# Patient Record
Sex: Male | Born: 1935 | Race: White | Hispanic: No | Smoking: Current every day smoker
Health system: Southern US, Community
[De-identification: ages and names within clinical notes are randomized; demographics above are authoritative.]

## PROBLEM LIST (undated history)

## (undated) DIAGNOSIS — E039 Hypothyroidism, unspecified: Secondary | ICD-10-CM

## (undated) DIAGNOSIS — F419 Anxiety disorder, unspecified: Secondary | ICD-10-CM

## (undated) DIAGNOSIS — Z8489 Family history of other specified conditions: Secondary | ICD-10-CM

## (undated) DIAGNOSIS — N189 Chronic kidney disease, unspecified: Secondary | ICD-10-CM

## (undated) DIAGNOSIS — J40 Bronchitis, not specified as acute or chronic: Secondary | ICD-10-CM

## (undated) DIAGNOSIS — K2100 Gastro-esophageal reflux disease with esophagitis, without bleeding: Secondary | ICD-10-CM

## (undated) DIAGNOSIS — R0602 Shortness of breath: Secondary | ICD-10-CM

## (undated) DIAGNOSIS — Z8719 Personal history of other diseases of the digestive system: Secondary | ICD-10-CM

## (undated) DIAGNOSIS — T8859XA Other complications of anesthesia, initial encounter: Secondary | ICD-10-CM

## (undated) DIAGNOSIS — C801 Malignant (primary) neoplasm, unspecified: Secondary | ICD-10-CM

## (undated) DIAGNOSIS — B029 Zoster without complications: Secondary | ICD-10-CM

## (undated) DIAGNOSIS — I639 Cerebral infarction, unspecified: Secondary | ICD-10-CM

## (undated) DIAGNOSIS — E785 Hyperlipidemia, unspecified: Secondary | ICD-10-CM

## (undated) DIAGNOSIS — I219 Acute myocardial infarction, unspecified: Secondary | ICD-10-CM

## (undated) DIAGNOSIS — I10 Essential (primary) hypertension: Secondary | ICD-10-CM

## (undated) DIAGNOSIS — G4730 Sleep apnea, unspecified: Secondary | ICD-10-CM

## (undated) DIAGNOSIS — J449 Chronic obstructive pulmonary disease, unspecified: Secondary | ICD-10-CM

## (undated) DIAGNOSIS — K219 Gastro-esophageal reflux disease without esophagitis: Secondary | ICD-10-CM

## (undated) DIAGNOSIS — G459 Transient cerebral ischemic attack, unspecified: Secondary | ICD-10-CM

## (undated) DIAGNOSIS — I499 Cardiac arrhythmia, unspecified: Secondary | ICD-10-CM

## (undated) DIAGNOSIS — T4145XA Adverse effect of unspecified anesthetic, initial encounter: Secondary | ICD-10-CM

## (undated) HISTORY — PX: EYE SURGERY: SHX253

## (undated) HISTORY — PX: CARDIAC CATHETERIZATION: SHX172

---

## 1995-02-18 HISTORY — PX: BACK SURGERY: SHX140

## 1998-02-17 DIAGNOSIS — I219 Acute myocardial infarction, unspecified: Secondary | ICD-10-CM

## 1998-02-17 HISTORY — DX: Acute myocardial infarction, unspecified: I21.9

## 1998-02-17 HISTORY — PX: CORONARY ARTERY BYPASS GRAFT: SHX141

## 1998-12-19 ENCOUNTER — Encounter: Payer: Self-pay | Admitting: Surgery

## 1998-12-19 ENCOUNTER — Inpatient Hospital Stay (HOSPITAL_COMMUNITY): Admission: AD | Admit: 1998-12-19 | Discharge: 1998-12-26 | Payer: Self-pay | Admitting: Surgery

## 1998-12-20 ENCOUNTER — Encounter: Payer: Self-pay | Admitting: Surgery

## 1998-12-21 ENCOUNTER — Encounter: Payer: Self-pay | Admitting: Surgery

## 1998-12-22 ENCOUNTER — Encounter: Payer: Self-pay | Admitting: Surgery

## 2009-02-17 DIAGNOSIS — C801 Malignant (primary) neoplasm, unspecified: Secondary | ICD-10-CM

## 2009-02-17 HISTORY — DX: Malignant (primary) neoplasm, unspecified: C80.1

## 2009-02-17 HISTORY — PX: OTHER SURGICAL HISTORY: SHX169

## 2010-02-17 HISTORY — PX: HERNIA REPAIR: SHX51

## 2010-11-22 DIAGNOSIS — I10 Essential (primary) hypertension: Secondary | ICD-10-CM

## 2010-12-18 DIAGNOSIS — I4891 Unspecified atrial fibrillation: Secondary | ICD-10-CM

## 2012-08-26 ENCOUNTER — Encounter (HOSPITAL_COMMUNITY): Payer: Self-pay | Admitting: *Deleted

## 2012-08-27 ENCOUNTER — Encounter (HOSPITAL_COMMUNITY): Payer: Self-pay | Admitting: Pharmacy Technician

## 2012-08-31 ENCOUNTER — Other Ambulatory Visit: Payer: Self-pay | Admitting: Gastroenterology

## 2012-08-31 NOTE — Addendum Note (Signed)
Addended by: Palestine Mosco on: 08/31/2012 04:37 PM   Modules accepted: Orders  

## 2012-09-01 ENCOUNTER — Ambulatory Visit (HOSPITAL_COMMUNITY): Payer: Medicare Other | Admitting: Anesthesiology

## 2012-09-01 ENCOUNTER — Ambulatory Visit (HOSPITAL_COMMUNITY)
Admission: RE | Admit: 2012-09-01 | Discharge: 2012-09-01 | Disposition: A | Discharge status: Home / Self Care | Payer: Medicare Other | Source: Ambulatory Visit | Attending: Gastroenterology | Admitting: Gastroenterology

## 2012-09-01 ENCOUNTER — Encounter (HOSPITAL_COMMUNITY): Payer: Self-pay | Admitting: Anesthesiology

## 2012-09-01 ENCOUNTER — Encounter (HOSPITAL_COMMUNITY): Payer: Self-pay | Admitting: *Deleted

## 2012-09-01 ENCOUNTER — Encounter (HOSPITAL_COMMUNITY)
Admission: RE | Disposition: A | Discharge status: Home / Self Care | Payer: Self-pay | Source: Ambulatory Visit | Attending: Gastroenterology

## 2012-09-01 DIAGNOSIS — I252 Old myocardial infarction: Secondary | ICD-10-CM | POA: Insufficient documentation

## 2012-09-01 DIAGNOSIS — F17200 Nicotine dependence, unspecified, uncomplicated: Secondary | ICD-10-CM | POA: Insufficient documentation

## 2012-09-01 DIAGNOSIS — R6889 Other general symptoms and signs: Secondary | ICD-10-CM | POA: Insufficient documentation

## 2012-09-01 DIAGNOSIS — E039 Hypothyroidism, unspecified: Secondary | ICD-10-CM | POA: Insufficient documentation

## 2012-09-01 DIAGNOSIS — K8689 Other specified diseases of pancreas: Secondary | ICD-10-CM | POA: Insufficient documentation

## 2012-09-01 DIAGNOSIS — K219 Gastro-esophageal reflux disease without esophagitis: Secondary | ICD-10-CM | POA: Insufficient documentation

## 2012-09-01 DIAGNOSIS — K869 Disease of pancreas, unspecified: Secondary | ICD-10-CM | POA: Insufficient documentation

## 2012-09-01 DIAGNOSIS — Z951 Presence of aortocoronary bypass graft: Secondary | ICD-10-CM | POA: Insufficient documentation

## 2012-09-01 DIAGNOSIS — I2510 Atherosclerotic heart disease of native coronary artery without angina pectoris: Secondary | ICD-10-CM | POA: Insufficient documentation

## 2012-09-01 DIAGNOSIS — I4891 Unspecified atrial fibrillation: Secondary | ICD-10-CM | POA: Insufficient documentation

## 2012-09-01 HISTORY — DX: Essential (primary) hypertension: I10

## 2012-09-01 HISTORY — DX: Gastro-esophageal reflux disease without esophagitis: K21.9

## 2012-09-01 HISTORY — DX: Hypothyroidism, unspecified: E03.9

## 2012-09-01 HISTORY — DX: Anxiety disorder, unspecified: F41.9

## 2012-09-01 HISTORY — DX: Hyperlipidemia, unspecified: E78.5

## 2012-09-01 HISTORY — DX: Cardiac arrhythmia, unspecified: I49.9

## 2012-09-01 HISTORY — PX: EUS: SHX5427

## 2012-09-01 HISTORY — DX: Gastro-esophageal reflux disease with esophagitis: K21.0

## 2012-09-01 HISTORY — DX: Sleep apnea, unspecified: G47.30

## 2012-09-01 HISTORY — DX: Gastro-esophageal reflux disease with esophagitis, without bleeding: K21.00

## 2012-09-01 HISTORY — DX: Malignant (primary) neoplasm, unspecified: C80.1

## 2012-09-01 HISTORY — DX: Transient cerebral ischemic attack, unspecified: G45.9

## 2012-09-01 HISTORY — DX: Chronic obstructive pulmonary disease, unspecified: J44.9

## 2012-09-01 HISTORY — DX: Bronchitis, not specified as acute or chronic: J40

## 2012-09-01 HISTORY — PX: FINE NEEDLE ASPIRATION: SHX5430

## 2012-09-01 HISTORY — DX: Acute myocardial infarction, unspecified: I21.9

## 2012-09-01 SURGERY — ESOPHAGEAL ENDOSCOPIC ULTRASOUND (EUS) RADIAL
Anesthesia: Monitor Anesthesia Care

## 2012-09-01 MED ORDER — PROPOFOL INFUSION 10 MG/ML OPTIME
INTRAVENOUS | Status: DC | PRN
  Administered 2012-09-01: 100 ug/kg/min via INTRAVENOUS

## 2012-09-01 MED ORDER — CIPROFLOXACIN IN D5W 400 MG/200ML IV SOLN
INTRAVENOUS | Status: AC
  Filled 2012-09-01: qty 200

## 2012-09-01 MED ORDER — SODIUM CHLORIDE 0.9 % IV SOLN: INTRAVENOUS | Status: DC

## 2012-09-01 MED ORDER — BUTAMBEN-TETRACAINE-BENZOCAINE 2-2-14 % EX AERO
INHALATION_SPRAY | CUTANEOUS | Status: DC | PRN
  Administered 2012-09-01: 1 via TOPICAL

## 2012-09-01 MED ORDER — LACTATED RINGERS IV SOLN
INTRAVENOUS | Status: DC
  Administered 2012-09-01: 1000 mL via INTRAVENOUS

## 2012-09-01 MED ORDER — KETAMINE HCL 50 MG/ML IJ SOLN
INTRAMUSCULAR | Status: DC | PRN
  Administered 2012-09-01: 25 mg via INTRAMUSCULAR

## 2012-09-01 MED ORDER — EPHEDRINE SULFATE 50 MG/ML IJ SOLN
INTRAMUSCULAR | Status: DC | PRN
  Administered 2012-09-01 (×2): 5 mg via INTRAVENOUS

## 2012-09-01 MED ORDER — PROMETHAZINE HCL 25 MG/ML IJ SOLN: 6.2500 mg | INTRAMUSCULAR | Status: DC | PRN

## 2012-09-01 MED ORDER — CIPROFLOXACIN HCL 500 MG PO TABS: 500.0000 mg | ORAL_TABLET | Freq: Two times a day (BID) | ORAL | Status: DC

## 2012-09-01 MED ORDER — CIPROFLOXACIN IN D5W 400 MG/200ML IV SOLN
INTRAVENOUS | Status: DC | PRN
  Administered 2012-09-01: 400 mg via INTRAVENOUS

## 2012-09-01 MED ORDER — MIDAZOLAM HCL 5 MG/5ML IJ SOLN
INTRAMUSCULAR | Status: DC | PRN
  Administered 2012-09-01: 2 mg via INTRAVENOUS

## 2012-09-01 NOTE — Op Note (Signed)
[facility] [address] [place], [address]   ENDOSCOPIC ULTRASOUND PROCEDURE REPORT  PATIENT: Brandon Cisneros, Brandon Cisneros  MR#: [id number] BIRTHDATE: [birth year]  GENDER: Male ENDOSCOPIST: Willis Modena, MD REFERRED BY:  Duwaine Maxin, MD; Corky Downs, M.D. PROCEDURE DATE:  09/01/2012 PROCEDURE:   Upper EUS w/FNA ASA CLASS:      Class III INDICATIONS:   1.  pancreatic mass. MEDICATIONS: Cipro 400 mg IV, Cetacaine spray x 2, and MAC sedation, administered by CRNA  DESCRIPTION OF PROCEDURE:   After the risks benefits and alternatives of the procedure were  explained, informed consent was obtained. The patient was then placed in the left, lateral, decubitus postion and IV sedation was administered. Throughout the procedure, the patients blood pressure, pulse and oxygen saturations were monitored continuously.  Under direct visualization, the EUS scope  endoscope was introduced through the mouth  and advanced to the second portion of the duodenum .  Water was used as necessary to provide an acoustic interface.  Upon completion of the imaging, water was removed and the patient was sent to the recovery room in satisfactory condition.     FINDINGS:      Diffusely atrophic pancreas, with multiple parenchymal and intraductal pancreatic stones.  In general region of head and neck of pancreas, there was ill-defined amorphous, heterogenous (solid with admixed cystic components) lesion, either on superior portion of the pancreas or closely bordering it. Lesion approximately 20 x 30 mm in size.  Few small benign-appearing peripancreatic  lymph nodes seen.  Mass was biopsied (5 with 25-g needle, two in their entirety for cell block analysis).  IMPRESSION:     Pancreatic/peripancreatic lesion, biopsied as above. Extensive changes of chronic pancreatitis.  RECOMMENDATIONS:     1.  Watch for potential complications of procedure. 2.  Await cytology results. 3.  Cipro 500 mg  twice-a-day for 3 days. 4.  Will discuss with Dr. Anda Kraft, once biopsy results are back.    _______________________________ eSigned:  Willis Modena, MD 09/01/2012 12:50 PM   CC:

## 2012-09-01 NOTE — Transfer of Care (Signed)
Immediate Anesthesia Transfer of Care Note  Patient: Brandon Cisneros  Procedure(s) Performed: Procedure(s) with comments: ESOPHAGEAL ENDOSCOPIC ULTRASOUND (EUS) RADIAL (N/A) FINE NEEDLE ASPIRATION (FNA) LINEAR (N/A) - +or- fna  Patient Location: PACU and Endoscopy Unit  Anesthesia Type:MAC  Level of Consciousness: awake, alert , oriented and patient cooperative  Airway & Oxygen Therapy: Patient Spontanous Breathing and Patient connected to nasal cannula oxygen  Post-op Assessment: Report given to PACU RN, Post -op Vital signs reviewed and stable and Patient moving all extremities  Post vital signs: Reviewed and stable  Complications: No apparent anesthesia complications

## 2012-09-01 NOTE — Anesthesia Postprocedure Evaluation (Signed)
  Anesthesia Post-op Note  Patient: Brandon Cisneros  Procedure(s) Performed: Procedure(s) (LRB): ESOPHAGEAL ENDOSCOPIC ULTRASOUND (EUS) RADIAL (N/A) FINE NEEDLE ASPIRATION (FNA) LINEAR (N/A)  Patient Location: PACU  Anesthesia Type: MAC  Level of Consciousness: awake and alert   Airway and Oxygen Therapy: Patient Spontanous Breathing  Post-op Pain: mild  Post-op Assessment: Post-op Vital signs reviewed, Patient's Cardiovascular Status Stable, Respiratory Function Stable, Patent Airway and No signs of Nausea or vomiting  Last Vitals:  Filed Vitals:   09/01/12 1236  BP: 96/59  Temp: 36.6 C  Resp: 18    Post-op Vital Signs: stable   Complications: No apparent anesthesia complications

## 2012-09-01 NOTE — H&P (Signed)
Patient interval history reviewed.  Patient examined again.  There has been no change from documented H/P dated 08/26/12  (scanned into chart from our office) except as documented above.  Assessment:  1.  Pancreatic mass.  Plan:  1.  Endoscopic ultrasound with possible biopsies. 2.  Risks (bleeding, infection, bowel perforation that could require surgery, sedation-related changes in cardiopulmonary systems), benefits (identification and possible treatment of source of symptoms, exclusion of certain causes of symptoms), and alternatives (watchful waiting, radiographic imaging studies, empiric medical treatment) of upper endoscopy with ultrasound and possible biopsies (EUS +/- FNA) were explained to patient/family in detail and patient wishes to proceed.

## 2012-09-01 NOTE — Anesthesia Preprocedure Evaluation (Addendum)
Anesthesia Evaluation  Patient identified by MRN, date of birth, ID band Patient awake    Reviewed: Allergy & Precautions, H&P , NPO status , Patient's Chart, lab work & pertinent test results  Airway Mallampati: II TM Distance: >3 FB Neck ROM: Full    Dental no notable dental hx.    Pulmonary Current Smoker,  breath sounds clear to auscultation  Pulmonary exam normal       Cardiovascular hypertension, + CAD, + Past MI and + CABG + dysrhythmias Atrial Fibrillation Rhythm:Irregular Rate:Normal     Neuro/Psych TIAnegative psych ROS   GI/Hepatic Neg liver ROS, GERD-  Medicated,  Endo/Other  Hypothyroidism   Renal/GU negative Renal ROS  negative genitourinary   Musculoskeletal negative musculoskeletal ROS (+)   Abdominal   Peds negative pediatric ROS (+)  Hematology negative hematology ROS (+)   Anesthesia Other Findings   Reproductive/Obstetrics negative OB ROS                         Anesthesia Physical Anesthesia Plan  ASA: III  Anesthesia Plan: MAC   Post-op Pain Management:    Induction: Intravenous  Airway Management Planned: Nasal Cannula  Additional Equipment:   Intra-op Plan:   Post-operative Plan:   Informed Consent: I have reviewed the patients History and Physical, chart, labs and discussed the procedure including the risks, benefits and alternatives for the proposed anesthesia with the patient or authorized representative who has indicated his/her understanding and acceptance.     Plan Discussed with: CRNA and Surgeon  Anesthesia Plan Comments:         Anesthesia Quick Evaluation

## 2012-09-01 NOTE — Preoperative (Signed)
Beta Blockers   Reason not to administer Beta Blockers:Not Applicable 

## 2012-09-02 ENCOUNTER — Encounter (HOSPITAL_COMMUNITY): Payer: Self-pay | Admitting: Gastroenterology

## 2012-09-13 ENCOUNTER — Telehealth: Payer: Self-pay | Admitting: Hematology and Oncology

## 2012-09-13 NOTE — Telephone Encounter (Signed)
S/W PT DTR IN RE NP APPT 07/30 @ 10:30 W/DR. VICTOR REFERRING DR. MARTERRE DX-[place]  WELCOME PACKET MAILED.

## 2012-09-14 ENCOUNTER — Telehealth: Payer: Self-pay

## 2012-09-14 NOTE — Telephone Encounter (Signed)
C/D 09/14/12 for appt. 09/15/12

## 2012-09-15 ENCOUNTER — Ambulatory Visit: Payer: Medicare Other

## 2012-09-15 ENCOUNTER — Other Ambulatory Visit: Payer: Medicare Other | Admitting: Lab

## 2012-09-15 ENCOUNTER — Ambulatory Visit (HOSPITAL_BASED_OUTPATIENT_CLINIC_OR_DEPARTMENT_OTHER): Payer: Medicare Other | Admitting: Hematology and Oncology

## 2012-09-15 ENCOUNTER — Encounter: Payer: Self-pay | Admitting: Hematology and Oncology

## 2012-09-15 VITALS — BP 144/77 | HR 55 | Temp 97.1°F | Resp 18 | Ht 71.0 in | Wt 143.3 lb

## 2012-09-15 DIAGNOSIS — C8589 Other specified types of non-Hodgkin lymphoma, extranodal and solid organ sites: Secondary | ICD-10-CM

## 2012-09-15 DIAGNOSIS — C8590 Non-Hodgkin lymphoma, unspecified, unspecified site: Secondary | ICD-10-CM

## 2012-09-15 NOTE — Progress Notes (Signed)
Checked in new patient. No financial issues. He has a living will/POA and will bring in next time. All communication via phone and mail only.

## 2012-09-17 ENCOUNTER — Other Ambulatory Visit: Payer: Self-pay | Admitting: *Deleted

## 2012-09-17 ENCOUNTER — Telehealth: Payer: Self-pay | Admitting: Hematology and Oncology

## 2012-09-17 NOTE — Telephone Encounter (Signed)
Added lb per for 8/4 per 8/1 pof/ s/w both pt and dtr penny re lb appt for 8/4 @ 1:15pm prior to scans @ [facility] @ 2pm.

## 2012-09-19 NOTE — Progress Notes (Signed)
ID: Pilar Plate OB: 1927-03-31  MR#: [id number]  CSN#:[id number]  PCP: Corky Downs, MD  HISTORY OF PRESENT ILLNESS: The patient is a 79 y.o.man who initially developed diffuse pain for 4 days 6 weeks ago.He was seen by PCP who arranged abdominal U/S and chest x-ray. Abdominal ultrasound showed a density in pancreatic head. The patient had MRI scan which was positive for greater then 3 cm mass in pancreatic head. He had upper EUS with FNA of pancreatic mass. 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 FNA material consists of lymphoid appearing tissue characterized by relative abundance of large lymphoid cells associated with necrosis/apoptosis. Material for flow cytometric analysis is not available for analysis. Immunohistochemical stains were performed on a somewhat limited cell block material. The stains include LCA, CD20, CD79a, CD3, CD43, BCL-2, CD10 and BCL-6 with appropriate controls. The large lymphoid appearing cells are positive for LCA, CD79a, BCL-2, BCL-6 and CD20 (partial). This is admixed with a minor population of T cells as seen with CD3 and CD43. 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 Large cell type is favored. The patient and family presented today for discussion of those finding. He reported diminished appetite and weight loss about 10 lb in 3 months. He is using hearing aid. He has nasal congestion and clear nasal discharge. His dyspnea secondary to COPD is worse. Patient has cough with occasional clear sputum. Patient complains on abdominal pain which under control with medication. He handle constipation with over the counter medication. No other complaints.   Review of Systems  Constitutional: Positive for weight loss. Negative for fever, chills, malaise/fatigue and diaphoresis.  HENT: Positive for hearing loss and congestion. Negative for ear pain, nosebleeds, sore throat, neck pain, tinnitus and ear  discharge.   Eyes: Negative for blurred vision, double vision and photophobia.  Respiratory: Positive for cough, sputum production and shortness of breath. Negative for hemoptysis and wheezing.   Cardiovascular: Negative for chest pain, palpitations, orthopnea, claudication, leg swelling and PND.  Gastrointestinal: Positive for abdominal pain and constipation. Negative for heartburn, nausea, vomiting, diarrhea and blood in stool.  Genitourinary: Negative for dysuria, urgency, frequency and hematuria.  Musculoskeletal: Negative for myalgias, back pain and joint pain.  Skin: Negative for itching and rash.  Neurological: Negative for weakness and headaches.    PAST MEDICAL HISTORY: Past Medical History  Diagnosis Date  . Hypertension   . GERD (gastroesophageal reflux disease)   . Cancer 2011    prostate  . Dysrhythmia     afib  . Myocardial infarction 2000  . Anxiety     occasional, no meds for  . COPD (chronic obstructive pulmonary disease)   . Bronchitis 2 months ago  . TIA (transient ischemic attack) 5 years ago    questionable  . Sleep apnea     no cpap used  . Reflux esophagitis   . Hyperlipidemia   . Hypothyroidism     no current thryoid meds    PAST SURGICAL HISTORY: Past Surgical History  Procedure Laterality Date  . Radioactive seed implants  2011  . Hernia repair  2012  . Coronary artery bypass graft  2000    x 1  . Back surgery  1997    upper back  . Eus N/A 09/01/2012    Procedure: ESOPHAGEAL ENDOSCOPIC ULTRASOUND (EUS) RADIAL;  Surgeon: Willis Modena, MD;  Location: [facility] ENDOSCOPY;  Service: Endoscopy;  Laterality: N/A;  . Fine needle aspiration N/A 09/01/2012  Procedure: FINE NEEDLE ASPIRATION (FNA) LINEAR;  Surgeon: Willis Modena, MD;  Location: [facility] ENDOSCOPY;  Service: Endoscopy;  Laterality: N/A;  +or- fna    FAMILY HISTORY No family history on file.  HEALTH MAINTENANCE: History  Substance Use Topics  . Smoking status: Current Every Day Smoker --  0.25 packs/day for 54 years    Types: Cigarettes  . Smokeless tobacco: Never Used  . Alcohol Use: No   No Known Allergies  Current Outpatient Prescriptions  Medication Sig Dispense Refill  . amiodarone (PACERONE) 200 MG tablet Take 100 mg by mouth every morning.      Marland Kitchen aspirin 81 MG tablet Take 81 mg by mouth daily.      . clopidogrel (PLAVIX) 75 MG tablet Take 75 mg by mouth daily.      . enalapril (VASOTEC) 5 MG tablet Take 2.5 mg by mouth every morning.      . Ibuprofen-Diphenhydramine Cit (IBUPROFEN PM PO) Take 1 tablet by mouth at bedtime as needed (for sleep).      . pantoprazole (PROTONIX) 40 MG tablet Take 40 mg by mouth at bedtime.      . simvastatin (ZOCOR) 20 MG tablet Take 20 mg by mouth every evening.      . tamsulosin (FLOMAX) 0.4 MG CAPS Take 0.4 mg by mouth daily.       No current facility-administered medications for this visit.    OBJECTIVE: Filed Vitals:   09/15/12 1109  BP: 144/77  Pulse: 55  Temp: 97.1 F (36.2 C)  Resp: 18     Body mass index is 20 kg/(m^2).    ECOG FS: 1  HEENT: Sclerae anicteric.  Conjunctivae were pink. Pupils round and reactive bilaterally. Oral mucosa is moist without ulceration or thrush. No occipital, submandibular, cervical, supraclavicular or axillar adenopathy. Lungs: clear to auscultation without wheezes. No rales or rhonchi. Heart: regular rate and rhythm. No murmur, gallop or rubs. Abdomen: soft, non tender. No guarding or rebound tenderness. Bowel sounds are present. No palpable hepatosplenomegaly. MSK: no focal spinal tenderness. Extremities: No clubbing or cyanosis.No calf tenderness to palpitation, no peripheral edema. The patient had grossly intact strength in upper and lower extremities Neuro: non-focal, alert and oriented to time, person and place, appropriate affect  LAB RESULTS:  CMP  No results found for this basename: na, k, cl, co2, glucose, bun, creatinine, calcium, prot, albumin, ast, alt, alkphos, bilitot,  gfrnonaa, gfraa    I No results found for this basename: SPEP, UPEP,  kappa and lambda light chains    No results found for this basename: WBC, NEUTROABS, HGB, HCT, MCV, PLT      Chemistry   No results found for this basename: NA, K, CL, CO2, BUN, CREATININE, GLU   No results found for this basename: CALCIUM, ALKPHOS, AST, ALT, BILITOT      STUDIES: No results found.  ASSESSMENT:  Pancreatic mass, FNA of which reported FINDINGS CONSISTENT WITH HIGH GRADE B CELL LYMPHOPROLIFERATIVE PROCESS, PLAN: We discussed NCCN guidelines recommendations for initial work up for presumable DLBCL. We will proceed with PET/CT then excisional LN biopsy will be arrange.Bone marrow biopsy will be done. CBC, uric acid, LDH, Comprehensive metabolic panel, hepatitis B testing will be done. I will see patient next week when all results will be available.   Myra Rude, MD   09/19/2012 11:47 PM

## 2012-09-20 ENCOUNTER — Encounter (HOSPITAL_COMMUNITY)
Admission: RE | Admit: 2012-09-20 | Discharge: 2012-09-20 | Disposition: A | Discharge status: Home / Self Care | Payer: Medicare Other | Source: Ambulatory Visit | Attending: Hematology and Oncology | Admitting: Hematology and Oncology

## 2012-09-20 ENCOUNTER — Other Ambulatory Visit (HOSPITAL_BASED_OUTPATIENT_CLINIC_OR_DEPARTMENT_OTHER): Payer: Medicare Other

## 2012-09-20 ENCOUNTER — Ambulatory Visit (HOSPITAL_COMMUNITY)
Admission: RE | Admit: 2012-09-20 | Discharge: 2012-09-20 | Disposition: A | Discharge status: Home / Self Care | Payer: Medicare Other | Source: Ambulatory Visit | Attending: Hematology and Oncology | Admitting: Hematology and Oncology

## 2012-09-20 DIAGNOSIS — K7689 Other specified diseases of liver: Secondary | ICD-10-CM | POA: Insufficient documentation

## 2012-09-20 DIAGNOSIS — I714 Abdominal aortic aneurysm, without rupture, unspecified: Secondary | ICD-10-CM | POA: Insufficient documentation

## 2012-09-20 DIAGNOSIS — C8590 Non-Hodgkin lymphoma, unspecified, unspecified site: Secondary | ICD-10-CM

## 2012-09-20 DIAGNOSIS — R599 Enlarged lymph nodes, unspecified: Secondary | ICD-10-CM | POA: Insufficient documentation

## 2012-09-20 DIAGNOSIS — J986 Disorders of diaphragm: Secondary | ICD-10-CM | POA: Insufficient documentation

## 2012-09-20 DIAGNOSIS — C8589 Other specified types of non-Hodgkin lymphoma, extranodal and solid organ sites: Secondary | ICD-10-CM

## 2012-09-20 DIAGNOSIS — C61 Malignant neoplasm of prostate: Secondary | ICD-10-CM | POA: Insufficient documentation

## 2012-09-20 DIAGNOSIS — R911 Solitary pulmonary nodule: Secondary | ICD-10-CM | POA: Insufficient documentation

## 2012-09-20 DIAGNOSIS — K861 Other chronic pancreatitis: Secondary | ICD-10-CM | POA: Insufficient documentation

## 2012-09-20 DIAGNOSIS — I7090 Unspecified atherosclerosis: Secondary | ICD-10-CM | POA: Insufficient documentation

## 2012-09-20 LAB — URIC ACID (CC13): Uric Acid, Serum: 4.1 mg/dl (ref 2.6–7.4)

## 2012-09-20 LAB — COMPREHENSIVE METABOLIC PANEL (CC13)
AST: 15 U/L (ref 5–34)
Albumin: 3.3 g/dL — ABNORMAL LOW (ref 3.5–5.0)
Alkaline Phosphatase: 57 U/L (ref 40–150)
BUN: 12.9 mg/dL (ref 7.0–26.0)
Calcium: 8.9 mg/dL (ref 8.4–10.4)
Chloride: 105 mEq/L (ref 98–109)
Glucose: 81 mg/dl (ref 70–140)
Potassium: 4.6 mEq/L (ref 3.5–5.1)
Sodium: 134 mEq/L — ABNORMAL LOW (ref 136–145)
Total Protein: 6.7 g/dL (ref 6.4–8.3)

## 2012-09-20 LAB — GLUCOSE, CAPILLARY: Glucose-Capillary: 89 mg/dL (ref 70–99)

## 2012-09-20 LAB — CBC WITH DIFFERENTIAL/PLATELET
Basophils Absolute: 0.1 10*3/uL (ref 0.0–0.1)
Eosinophils Absolute: 0.4 10*3/uL (ref 0.0–0.5)
HGB: 12.4 g/dL — ABNORMAL LOW (ref 13.0–17.1)
NEUT#: 5 10*3/uL (ref 1.5–6.5)
RBC: 4.17 10*6/uL — ABNORMAL LOW (ref 4.20–5.82)
RDW: 14 % (ref 11.0–14.6)
WBC: 8 10*3/uL (ref 4.0–10.3)
lymph#: 1.8 10*3/uL (ref 0.9–3.3)

## 2012-09-20 MED ORDER — IOHEXOL 300 MG/ML  SOLN
80.0000 mL | Freq: Once | INTRAMUSCULAR | Status: AC | PRN
  Administered 2012-09-20: 80 mL via INTRAVENOUS

## 2012-09-20 MED ORDER — FLUDEOXYGLUCOSE F - 18 (FDG) INJECTION
17.2000 | Freq: Once | INTRAVENOUS | Status: AC | PRN
  Administered 2012-09-20: 17.2 via INTRAVENOUS

## 2012-09-22 ENCOUNTER — Other Ambulatory Visit: Payer: Self-pay

## 2012-09-27 ENCOUNTER — Encounter (HOSPITAL_COMMUNITY): Payer: Self-pay | Admitting: Pharmacy Technician

## 2012-09-28 ENCOUNTER — Encounter: Payer: Self-pay | Admitting: *Deleted

## 2012-09-28 ENCOUNTER — Other Ambulatory Visit: Payer: Self-pay | Admitting: Radiology

## 2012-09-28 NOTE — Progress Notes (Signed)
Copies of Advanced Directives, POA received,  Given to Medical Records to scan into EMR.

## 2012-09-29 ENCOUNTER — Other Ambulatory Visit: Payer: Self-pay | Admitting: *Deleted

## 2012-09-30 ENCOUNTER — Ambulatory Visit (HOSPITAL_COMMUNITY)
Admission: RE | Admit: 2012-09-30 | Discharge: 2012-09-30 | Disposition: A | Discharge status: Home / Self Care | Payer: Medicare Other | Source: Ambulatory Visit | Attending: Hematology and Oncology | Admitting: Hematology and Oncology

## 2012-09-30 DIAGNOSIS — Z951 Presence of aortocoronary bypass graft: Secondary | ICD-10-CM | POA: Diagnosis not present

## 2012-09-30 DIAGNOSIS — D649 Anemia, unspecified: Secondary | ICD-10-CM | POA: Insufficient documentation

## 2012-09-30 DIAGNOSIS — K219 Gastro-esophageal reflux disease without esophagitis: Secondary | ICD-10-CM | POA: Insufficient documentation

## 2012-09-30 DIAGNOSIS — Z79899 Other long term (current) drug therapy: Secondary | ICD-10-CM | POA: Insufficient documentation

## 2012-09-30 DIAGNOSIS — E785 Hyperlipidemia, unspecified: Secondary | ICD-10-CM | POA: Insufficient documentation

## 2012-09-30 DIAGNOSIS — I10 Essential (primary) hypertension: Secondary | ICD-10-CM | POA: Diagnosis not present

## 2012-09-30 DIAGNOSIS — R634 Abnormal weight loss: Secondary | ICD-10-CM | POA: Diagnosis not present

## 2012-09-30 DIAGNOSIS — Z8673 Personal history of transient ischemic attack (TIA), and cerebral infarction without residual deficits: Secondary | ICD-10-CM | POA: Diagnosis not present

## 2012-09-30 DIAGNOSIS — K869 Disease of pancreas, unspecified: Secondary | ICD-10-CM | POA: Diagnosis not present

## 2012-09-30 DIAGNOSIS — I252 Old myocardial infarction: Secondary | ICD-10-CM | POA: Insufficient documentation

## 2012-09-30 DIAGNOSIS — D47Z9 Other specified neoplasms of uncertain behavior of lymphoid, hematopoietic and related tissue: Secondary | ICD-10-CM | POA: Insufficient documentation

## 2012-09-30 DIAGNOSIS — C8590 Non-Hodgkin lymphoma, unspecified, unspecified site: Secondary | ICD-10-CM

## 2012-09-30 DIAGNOSIS — J4489 Other specified chronic obstructive pulmonary disease: Secondary | ICD-10-CM | POA: Insufficient documentation

## 2012-09-30 DIAGNOSIS — J449 Chronic obstructive pulmonary disease, unspecified: Secondary | ICD-10-CM | POA: Insufficient documentation

## 2012-09-30 LAB — CBC
Hemoglobin: 12 g/dL — ABNORMAL LOW (ref 13.0–17.0)
MCH: 29.3 pg (ref 26.0–34.0)
MCHC: 34.1 g/dL (ref 30.0–36.0)
MCV: 86.1 fL (ref 78.0–100.0)
RBC: 4.09 MIL/uL — ABNORMAL LOW (ref 4.22–5.81)

## 2012-09-30 LAB — PROTIME-INR: Prothrombin Time: 12.6 seconds (ref 11.6–15.2)

## 2012-09-30 MED ORDER — MIDAZOLAM HCL 2 MG/2ML IJ SOLN
INTRAMUSCULAR | Status: AC
  Filled 2012-09-30: qty 4

## 2012-09-30 MED ORDER — MIDAZOLAM HCL 2 MG/2ML IJ SOLN
INTRAMUSCULAR | Status: AC | PRN
  Administered 2012-09-30: 1 mg via INTRAVENOUS

## 2012-09-30 MED ORDER — FENTANYL CITRATE 0.05 MG/ML IJ SOLN
INTRAMUSCULAR | Status: AC
  Filled 2012-09-30: qty 4

## 2012-09-30 MED ORDER — HYDROCODONE-ACETAMINOPHEN 5-325 MG PO TABS
1.0000 | ORAL_TABLET | ORAL | Status: DC | PRN
  Filled 2012-09-30: qty 2

## 2012-09-30 MED ORDER — SODIUM CHLORIDE 0.9 % IV SOLN
INTRAVENOUS | Status: DC
  Administered 2012-09-30: 09:00:00 via INTRAVENOUS

## 2012-09-30 MED ORDER — FENTANYL CITRATE 0.05 MG/ML IJ SOLN
INTRAMUSCULAR | Status: AC | PRN
  Administered 2012-09-30 (×2): 50 ug via INTRAVENOUS

## 2012-09-30 NOTE — H&P (Signed)
Brandon Cisneros is an 79 y.o. male.   Chief Complaint: "I'm having a bone marrow biopsy" HPI: Patient with history of high grade B cell lymphoproliferative process noted in a recent pancreatic mass biopsy presents today for CT guided bone marrow biopsy.  Past Medical History  Diagnosis Date  . Hypertension   . GERD (gastroesophageal reflux disease)   . Cancer 2011    prostate  . Dysrhythmia     afib  . Myocardial infarction 2000  . Anxiety     occasional, no meds for  . COPD (chronic obstructive pulmonary disease)   . Bronchitis 2 months ago  . TIA (transient ischemic attack) 5 years ago    questionable  . Sleep apnea     no cpap used  . Reflux esophagitis   . Hyperlipidemia   . Hypothyroidism     no current thryoid meds    Past Surgical History  Procedure Laterality Date  . Radioactive seed implants  2011  . Hernia repair  2012  . Coronary artery bypass graft  2000    x 1  . Back surgery  1997    upper back  . Eus N/A 09/01/2012    Procedure: ESOPHAGEAL ENDOSCOPIC ULTRASOUND (EUS) RADIAL;  Surgeon: Willis Modena, MD;  Location: [facility] ENDOSCOPY;  Service: Endoscopy;  Laterality: N/A;  . Fine needle aspiration N/A 09/01/2012    Procedure: FINE NEEDLE ASPIRATION (FNA) LINEAR;  Surgeon: Willis Modena, MD;  Location: [facility] ENDOSCOPY;  Service: Endoscopy;  Laterality: N/A;  +or- fna    No family history on file. Social History:  reports that he has been smoking Cigarettes.  He has a 13.5 pack-year smoking history. He has never used smokeless tobacco. He reports that he does not drink alcohol or use illicit drugs.  Allergies: No Known Allergies  Current outpatient prescriptions:amiodarone (PACERONE) 200 MG tablet, Take 100 mg by mouth every morning., Disp: , Rfl: ;  aspirin 81 MG tablet, Take 81 mg by mouth daily., Disp: , Rfl: ;  enalapril (VASOTEC) 5 MG tablet, Take 2.5 mg by mouth every morning., Disp: , Rfl: ;  Ibuprofen-Diphenhydramine Cit (IBUPROFEN PM PO), Take 1 tablet by  mouth at bedtime as needed (for sleep)., Disp: , Rfl:  pantoprazole (PROTONIX) 40 MG tablet, Take 40 mg by mouth at bedtime., Disp: , Rfl: ;  simvastatin (ZOCOR) 20 MG tablet, Take 20 mg by mouth every evening., Disp: , Rfl: ;  tamsulosin (FLOMAX) 0.4 MG CAPS, Take 0.4 mg by mouth daily., Disp: , Rfl: ;  clopidogrel (PLAVIX) 75 MG tablet, Take 75 mg by mouth daily., Disp: , Rfl:  Current facility-administered medications:0.9 %  sodium chloride infusion, , Intravenous, Continuous, D Jeananne Rama, PA-C, Last Rate: 20 mL/hr at 09/30/12 0911   Results for orders placed during the hospital encounter of 09/30/12  APTT      Result Value Range   aPTT 36  24 - 37 seconds  CBC      Result Value Range   WBC 6.8  4.0 - 10.5 K/uL   RBC 4.09 (*) 4.22 - 5.81 MIL/uL   Hemoglobin 12.0 (*) 13.0 - 17.0 g/dL   HCT 40.9 (*) 81.1 - 91.4 %   MCV 86.1  78.0 - 100.0 fL   MCH 29.3  26.0 - 34.0 pg   MCHC 34.1  30.0 - 36.0 g/dL   RDW 78.2  95.6 - 21.3 %   Platelets 305  150 - 400 K/uL  PROTIME-INR  Result Value Range   Prothrombin Time 12.6  11.6 - 15.2 seconds   INR 0.96  0.00 - 1.49    Review of Systems  Constitutional: Positive for weight loss. Negative for fever and chills.  Respiratory:       Occ cough/dyspnea with exertion  Cardiovascular: Negative for chest pain.  Gastrointestinal: Negative for nausea, vomiting and blood in stool.       Occ abd pain  Musculoskeletal: Negative for back pain.  Neurological: Negative for headaches.  Endo/Heme/Allergies: Bruises/bleeds easily.    Blood pressure 132/68, pulse 58, temperature 97.4 F (36.3 C), temperature source Oral, resp. rate 18, height 5\' 11"  (1.803 m), weight 143 lb (64.864 kg), SpO2 100.00%. Physical Exam  Constitutional: He is oriented to person, place, and time.  Thin elderly WM in NAD  Cardiovascular: Normal rate and regular rhythm.   Respiratory: Effort normal and breath sounds normal.  GI: Soft. Bowel sounds are normal. There is no  tenderness.  Musculoskeletal: Normal range of motion. He exhibits no edema.  Neurological: He is alert and oriented to person, place, and time.     Assessment/Plan Pt with hx high grade B cell lymphoproliferative process noted in recent pancreatic mass biopsy. Plan is for CT guided bone marrow biopsy today for further staging. Details/risks of procedure d/w pt/daughter with their understanding and consent.  ALLRED,D KEVIN 09/30/2012, 9:16 AM

## 2012-09-30 NOTE — Procedures (Signed)
CT guided bone marrow aspirate and biopsy.  No immediate complication. 

## 2012-10-01 ENCOUNTER — Telehealth: Payer: Self-pay | Admitting: *Deleted

## 2012-10-01 ENCOUNTER — Telehealth: Payer: Self-pay | Admitting: Hematology and Oncology

## 2012-10-01 NOTE — Telephone Encounter (Signed)
s.w. pt daughter and Corlis Leak n 8.22.14 appt with Dr. Jamey Ripa @ 10:15am

## 2012-10-01 NOTE — Telephone Encounter (Signed)
Pt scheduled to see Surgeon, Dr. Jamey Ripa on 8/22, consult for possible excisional lymph node biopsy.   Daughter asks if pt is to continue to hold his Plavix until he sees Dr. Jamey Ripa?  Pt has been holding since 8/12 for BMBx he had yesterday.  Pt is seeing his cardiologist this coming Monday on 8/18.   Instructed for pt to resume Plavix today and to ask Cardiologist who prescribes med about holding for surgeon's visit.  Explained that Surgery consult is office visit only and biopsy not scheduled yet.  They will need to see pt first.  Asked her to please let us know as soon as Biopsy is scheduled so we can schedule f/u appt w/ Dr. Alecia Lemming for one week after Biopsy.  She verbalized understanding.

## 2012-10-08 ENCOUNTER — Ambulatory Visit (INDEPENDENT_AMBULATORY_CARE_PROVIDER_SITE_OTHER): Payer: Medicare Other | Admitting: Surgery

## 2012-10-08 ENCOUNTER — Encounter (HOSPITAL_COMMUNITY): Payer: Self-pay | Admitting: Pharmacy Technician

## 2012-10-08 ENCOUNTER — Other Ambulatory Visit: Payer: Self-pay

## 2012-10-08 ENCOUNTER — Encounter (INDEPENDENT_AMBULATORY_CARE_PROVIDER_SITE_OTHER): Payer: Self-pay | Admitting: Surgery

## 2012-10-08 ENCOUNTER — Telehealth: Payer: Self-pay | Admitting: *Deleted

## 2012-10-08 ENCOUNTER — Telehealth: Payer: Self-pay | Admitting: Dietician

## 2012-10-08 VITALS — BP 136/76 | HR 61 | Temp 98.0°F | Resp 18 | Ht 71.5 in | Wt 143.0 lb

## 2012-10-08 DIAGNOSIS — C8588 Other specified types of non-Hodgkin lymphoma, lymph nodes of multiple sites: Secondary | ICD-10-CM

## 2012-10-08 NOTE — Progress Notes (Signed)
NAME: Brandon Cisneros       DOB: 12/28/1927           DATE: 10/08/2012       MRN:[id number]  CC:  Chief Complaint  Patient presents with  . New Evaluation    Lymphoma    HPI: this patient has being evaluated for lymphoma. Details are noted in prior progress notes but he basically has had a diagnosis of some lymph node proliferative disorder on a biopsy of a peripancreatic area. PET scan showed abnormal activity in some lymph nodes and an axillary lymph node biopsy has been requested by the oncologist. A biopsy of his bone marrow was negative. He is not having any axillary symptoms.  Past history family history review of systems are within the electronic medical record, reviewed but not dictated to this note.  EXAM: Vital signs: BP 136/76  Pulse 61  Temp(Src) 98 F (36.7 C)  Resp 18  Ht 5' 11.5" (1.816 m)  Wt 143 lb (64.864 kg)  BMI 19.67 kg/m2  General: Patient alert, oriented, NAD  Lungs: Clear to auscultation no respiratory problems Heart: Regular rhythm no murmurs rubs or gallops Abdomen: Soft and benign. No masses, no organomegaly.  Lymphatics: I do not appreciate any cervical adenopathy. He has a firm lymph node in the right axilla that is relatively low accessible. He also has some higher lymph nodes in the left axilla that are palpable.  Data reviewed: I reviewed the prior notes and laboratory studies. I discussed his PET scan with the radiologist. He does have activity in both axillas consistent with malignant activity. IMP: lymphoma  PLAN: I reviewed the indications risks and complications of axillary lymph node biopsy. We'll try to get this set up for him at his convenience. He is already off his Plavix. He understands this as an outpatient biopsy which involves removal of an entire lymph node.  Tex Conroy J 10/08/2012   

## 2012-10-08 NOTE — Telephone Encounter (Signed)
Patient's daughter Brandon Cisneros called reporting her dad's biopsy is scheduled for next Tuesday, 10-12-2012.  Reports Dr. Vertell Limber plan is to see Brandon Cisneros a week after the biopsy.  Will notify providers.

## 2012-10-08 NOTE — Telephone Encounter (Signed)
Penny called; Dr. Avel Peace is performing the Biopsy.

## 2012-10-08 NOTE — Telephone Encounter (Signed)
Patient's daughter Brandon Cisneros called reporting her dad's biopsy is scheduled for next Tuesday, 10-12-2012. Reports Dr. Vertell Limber plan is to see Brandon Cisneros a week after the biopsy. Will notify providers.   Notified Dr. Mitchell Heir of this and he would like to call the doctor who will be perfoming the biopsy.  Message left for [facility] to cal with this information.

## 2012-10-11 ENCOUNTER — Encounter (HOSPITAL_COMMUNITY): Payer: Self-pay

## 2012-10-11 ENCOUNTER — Other Ambulatory Visit (INDEPENDENT_AMBULATORY_CARE_PROVIDER_SITE_OTHER): Payer: Self-pay | Admitting: General Surgery

## 2012-10-11 ENCOUNTER — Other Ambulatory Visit: Payer: Self-pay

## 2012-10-11 ENCOUNTER — Encounter (HOSPITAL_COMMUNITY)
Admission: RE | Admit: 2012-10-11 | Discharge: 2012-10-11 | Disposition: A | Discharge status: Home / Self Care | Payer: Medicare Other | Source: Ambulatory Visit | Attending: General Surgery | Admitting: General Surgery

## 2012-10-11 HISTORY — DX: Shortness of breath: R06.02

## 2012-10-11 LAB — CBC
HCT: 36.8 % — ABNORMAL LOW (ref 39.0–52.0)
Hemoglobin: 12.4 g/dL — ABNORMAL LOW (ref 13.0–17.0)
MCH: 29.5 pg (ref 26.0–34.0)
MCHC: 33.7 g/dL (ref 30.0–36.0)
MCV: 87.6 fL (ref 78.0–100.0)
RDW: 14.5 % (ref 11.5–15.5)

## 2012-10-11 LAB — BASIC METABOLIC PANEL
BUN: 11 mg/dL (ref 6–23)
Calcium: 9 mg/dL (ref 8.4–10.5)
Creatinine, Ser: 1.41 mg/dL — ABNORMAL HIGH (ref 0.50–1.35)
GFR calc non Af Amer: 45 mL/min — ABNORMAL LOW (ref >90)
Glucose, Bld: 82 mg/dL (ref 70–99)
Potassium: 5.1 mEq/L (ref 3.5–5.1)

## 2012-10-11 NOTE — Patient Instructions (Addendum)
20 GAVIN TELFORD  10/11/2012   Your procedure is scheduled on: 10/12/12   Report to [facility] at 8:15 AM.  Call this number if you have problems the morning of surgery [phone]: [phone]   Remember:   Do not eat food or drink liquids After Midnight.     Take these medicines the morning of surgery with A SIP OF WATER: amiodarone, ativan if needed   Do not wear jewelry, make-up or nail polish.  Do not wear lotions, powders, or perfumes. You may wear deodorant.  Do not shave 48 hours prior to surgery. Men may shave face and neck.  Do not bring valuables to the hospital.  Contacts, dentures or bridgework may not be worn into surgery.    Patients discharged the day of surgery will not be allowed to drive home.  Name and phone number of your driver: Boyd Kerbs [phone]   Birdie Sons, RN  pre op nurse call if needed [phone]    FAILURE TO FOLLOW THESE INSTRUCTIONS MAY RESULT IN CANCELLATION OF YOUR SURGERY   Patient Signature: ___________________________________________

## 2012-10-11 NOTE — Progress Notes (Signed)
Order to obtain consent states that the surgeon is Dr. Jamey Ripa. Pt states that the surgeon is Dr. Abbey Chatters. Please place a new consent order in. Pt is having surgery 10/12/12

## 2012-10-11 NOTE — Progress Notes (Signed)
CT chest 09/20/12 on EPIC, PET scan 09/20/12 on EPIC

## 2012-10-12 ENCOUNTER — Ambulatory Visit (HOSPITAL_COMMUNITY)
Admission: RE | Admit: 2012-10-12 | Discharge: 2012-10-12 | Disposition: A | Discharge status: Home / Self Care | Payer: Medicare Other | Source: Ambulatory Visit | Attending: General Surgery | Admitting: General Surgery

## 2012-10-12 ENCOUNTER — Encounter (HOSPITAL_COMMUNITY): Payer: Self-pay | Admitting: Certified Registered Nurse Anesthetist

## 2012-10-12 ENCOUNTER — Ambulatory Visit (HOSPITAL_COMMUNITY): Payer: Medicare Other | Admitting: Certified Registered Nurse Anesthetist

## 2012-10-12 ENCOUNTER — Encounter (HOSPITAL_COMMUNITY)
Admission: RE | Disposition: A | Discharge status: Home / Self Care | Payer: Self-pay | Source: Ambulatory Visit | Attending: General Surgery

## 2012-10-12 ENCOUNTER — Telehealth: Payer: Self-pay | Admitting: Hematology and Oncology

## 2012-10-12 DIAGNOSIS — G4730 Sleep apnea, unspecified: Secondary | ICD-10-CM | POA: Insufficient documentation

## 2012-10-12 DIAGNOSIS — I10 Essential (primary) hypertension: Secondary | ICD-10-CM | POA: Insufficient documentation

## 2012-10-12 DIAGNOSIS — I4891 Unspecified atrial fibrillation: Secondary | ICD-10-CM | POA: Insufficient documentation

## 2012-10-12 DIAGNOSIS — E039 Hypothyroidism, unspecified: Secondary | ICD-10-CM | POA: Insufficient documentation

## 2012-10-12 DIAGNOSIS — Z951 Presence of aortocoronary bypass graft: Secondary | ICD-10-CM | POA: Insufficient documentation

## 2012-10-12 DIAGNOSIS — I252 Old myocardial infarction: Secondary | ICD-10-CM | POA: Insufficient documentation

## 2012-10-12 DIAGNOSIS — C8299 Follicular lymphoma, unspecified, extranodal and solid organ sites: Secondary | ICD-10-CM | POA: Insufficient documentation

## 2012-10-12 DIAGNOSIS — R599 Enlarged lymph nodes, unspecified: Secondary | ICD-10-CM | POA: Insufficient documentation

## 2012-10-12 DIAGNOSIS — C8584 Other specified types of non-Hodgkin lymphoma, lymph nodes of axilla and upper limb: Secondary | ICD-10-CM | POA: Insufficient documentation

## 2012-10-12 DIAGNOSIS — J449 Chronic obstructive pulmonary disease, unspecified: Secondary | ICD-10-CM | POA: Insufficient documentation

## 2012-10-12 DIAGNOSIS — J4489 Other specified chronic obstructive pulmonary disease: Secondary | ICD-10-CM | POA: Insufficient documentation

## 2012-10-12 HISTORY — PX: AXILLARY LYMPH NODE BIOPSY: SHX5737

## 2012-10-12 SURGERY — AXILLARY LYMPH NODE BIOPSY
Anesthesia: General | Site: Axilla | Laterality: Right | Wound class: Clean

## 2012-10-12 MED ORDER — FENTANYL CITRATE 0.05 MG/ML IJ SOLN: 25.0000 ug | INTRAMUSCULAR | Status: DC | PRN

## 2012-10-12 MED ORDER — HYDROCODONE-ACETAMINOPHEN 5-325 MG PO TABS: 1.0000 | ORAL_TABLET | Freq: Once | ORAL | Status: DC

## 2012-10-12 MED ORDER — BUPIVACAINE-EPINEPHRINE (PF) 0.5% -1:200000 IJ SOLN
INTRAMUSCULAR | Status: AC
  Filled 2012-10-12: qty 10

## 2012-10-12 MED ORDER — CEFAZOLIN SODIUM-DEXTROSE 2-3 GM-% IV SOLR
2.0000 g | INTRAVENOUS | Status: AC
  Administered 2012-10-12: 2 g via INTRAVENOUS

## 2012-10-12 MED ORDER — CEFAZOLIN SODIUM-DEXTROSE 2-3 GM-% IV SOLR
INTRAVENOUS | Status: AC
  Filled 2012-10-12: qty 50

## 2012-10-12 MED ORDER — MORPHINE SULFATE 10 MG/ML IJ SOLN: 1.0000 mg | INTRAMUSCULAR | Status: DC | PRN

## 2012-10-12 MED ORDER — FENTANYL CITRATE 0.05 MG/ML IJ SOLN
INTRAMUSCULAR | Status: DC | PRN
  Administered 2012-10-12: 100 ug via INTRAVENOUS

## 2012-10-12 MED ORDER — ONDANSETRON HCL 4 MG/2ML IJ SOLN: 4.0000 mg | Freq: Four times a day (QID) | INTRAMUSCULAR | Status: DC | PRN

## 2012-10-12 MED ORDER — LACTATED RINGERS IV SOLN: INTRAVENOUS | Status: DC

## 2012-10-12 MED ORDER — ACETAMINOPHEN 650 MG RE SUPP
650.0000 mg | RECTAL | Status: DC | PRN
  Filled 2012-10-12: qty 1

## 2012-10-12 MED ORDER — 0.9 % SODIUM CHLORIDE (POUR BTL) OPTIME
TOPICAL | Status: DC | PRN
  Administered 2012-10-12: 1000 mL

## 2012-10-12 MED ORDER — HYDROCODONE-ACETAMINOPHEN 5-325 MG PO TABS: 1.0000 | ORAL_TABLET | ORAL | Status: DC | PRN

## 2012-10-12 MED ORDER — ACETAMINOPHEN 500 MG PO TABS
1000.0000 mg | ORAL_TABLET | Freq: Four times a day (QID) | ORAL | Status: DC
  Filled 2012-10-12 (×4): qty 2

## 2012-10-12 MED ORDER — EPHEDRINE SULFATE 50 MG/ML IJ SOLN
INTRAMUSCULAR | Status: DC | PRN
  Administered 2012-10-12: 10 mg via INTRAVENOUS

## 2012-10-12 MED ORDER — LIDOCAINE HCL 1 % IJ SOLN
INTRAMUSCULAR | Status: DC | PRN
  Administered 2012-10-12: 60 mg via INTRADERMAL

## 2012-10-12 MED ORDER — LACTATED RINGERS IV SOLN
INTRAVENOUS | Status: DC | PRN
  Administered 2012-10-12 (×2): via INTRAVENOUS

## 2012-10-12 MED ORDER — ATROPINE SULFATE 0.4 MG/ML IJ SOLN
INTRAMUSCULAR | Status: DC | PRN
  Administered 2012-10-12: 0.4 mg via INTRAVENOUS

## 2012-10-12 MED ORDER — SODIUM CHLORIDE 0.9 % IJ SOLN: 3.0000 mL | INTRAMUSCULAR | Status: DC | PRN

## 2012-10-12 MED ORDER — PROPOFOL 10 MG/ML IV BOLUS
INTRAVENOUS | Status: DC | PRN
  Administered 2012-10-12: 50 mg via INTRAVENOUS
  Administered 2012-10-12: 100 mg via INTRAVENOUS

## 2012-10-12 MED ORDER — ACETAMINOPHEN 325 MG PO TABS: 650.0000 mg | ORAL_TABLET | ORAL | Status: DC | PRN

## 2012-10-12 MED ORDER — BUPIVACAINE-EPINEPHRINE PF 0.5-1:200000 % IJ SOLN
INTRAMUSCULAR | Status: DC | PRN
  Administered 2012-10-12: 10 mL

## 2012-10-12 SURGICAL SUPPLY — 32 items
APL SKNCLS STERI-STRIP NONHPOA (GAUZE/BANDAGES/DRESSINGS) ×1
APPLIER CLIP 9.375 MED OPEN (MISCELLANEOUS) ×2
APR CLP MED 9.3 20 MLT OPN (MISCELLANEOUS) ×1
BANDAGE ELASTIC 6 VELCRO ST LF (GAUZE/BANDAGES/DRESSINGS) ×1 IMPLANT
BENZOIN TINCTURE PRP APPL 2/3 (GAUZE/BANDAGES/DRESSINGS) ×2 IMPLANT
BINDER BREAST LRG (GAUZE/BANDAGES/DRESSINGS) IMPLANT
BINDER BREAST XLRG (GAUZE/BANDAGES/DRESSINGS) IMPLANT
CHLORAPREP W/TINT 26ML (MISCELLANEOUS) ×2 IMPLANT
CLIP APPLIE 9.375 MED OPEN (MISCELLANEOUS) ×1 IMPLANT
CLOTH BEACON ORANGE TIMEOUT ST (SAFETY) ×2 IMPLANT
COVER SURGICAL LIGHT HANDLE (MISCELLANEOUS) ×2 IMPLANT
DRAIN CHANNEL 19F RND (DRAIN) ×1 IMPLANT
DRAPE LAPAROSCOPIC ABDOMINAL (DRAPES) ×2 IMPLANT
DRAPE UTILITY 15X26 (DRAPE) ×5 IMPLANT
ELECT REM PT RETURN 9FT ADLT (ELECTROSURGICAL) ×2
ELECTRODE REM PT RTRN 9FT ADLT (ELECTROSURGICAL) ×1 IMPLANT
EVACUATOR SILICONE 100CC (DRAIN) ×1 IMPLANT
GLOVE BIOGEL PI IND STRL 8 (GLOVE) ×1 IMPLANT
GLOVE BIOGEL PI INDICATOR 8 (GLOVE) ×1
GLOVE ECLIPSE 8.0 STRL XLNG CF (GLOVE) ×2 IMPLANT
GOWN STRL NON-REIN LRG LVL3 (GOWN DISPOSABLE) ×4 IMPLANT
KIT BASIN OR (CUSTOM PROCEDURE TRAY) ×2 IMPLANT
NS IRRIG 1000ML POUR BTL (IV SOLUTION) ×2 IMPLANT
PACK GENERAL/GYN (CUSTOM PROCEDURE TRAY) ×2 IMPLANT
SPONGE GAUZE 4X4 12PLY (GAUZE/BANDAGES/DRESSINGS) ×2 IMPLANT
STAPLER VISISTAT 35W (STAPLE) ×1 IMPLANT
STRIP CLOSURE SKIN 1/2X4 (GAUZE/BANDAGES/DRESSINGS) ×2 IMPLANT
SUT ETHILON 3 0 PS 1 (SUTURE) ×1 IMPLANT
SUT MNCRL AB 4-0 PS2 18 (SUTURE) ×1 IMPLANT
SUT VIC AB 3-0 SH 18 (SUTURE) ×1 IMPLANT
TOWEL OR 17X26 10 PK STRL BLUE (TOWEL DISPOSABLE) ×2 IMPLANT
WATER STERILE IRR 1000ML POUR (IV SOLUTION) IMPLANT

## 2012-10-12 NOTE — Anesthesia Postprocedure Evaluation (Signed)
  Anesthesia Post-op Note  Patient: Brandon Cisneros  Procedure(s) Performed: Procedure(s) (LRB): EXCISION RIGHT  AXILLARY LYMPH NODES (Right)  Patient Location: PACU  Anesthesia Type: General  Level of Consciousness: awake and alert   Airway and Oxygen Therapy: Patient Spontanous Breathing  Post-op Pain: mild  Post-op Assessment: Post-op Vital signs reviewed, Patient's Cardiovascular Status Stable, Respiratory Function Stable, Patent Airway and No signs of Nausea or vomiting  Last Vitals:  Filed Vitals:   10/12/12 1200  BP: 143/73  Pulse: 65  Temp: 36.6 C  Resp: 14    Post-op Vital Signs: stable   Complications: No apparent anesthesia complications

## 2012-10-12 NOTE — H&P (View-Only) (Signed)
NAME: Brandon Cisneros       DOB: 1927-12-15           DATE: 10/08/2012       QMV:[id number]  CC:  Chief Complaint  Patient presents with  . New Evaluation    Lymphoma    HPI: this patient has being evaluated for lymphoma. Details are noted in prior progress notes but he basically has had a diagnosis of some lymph node proliferative disorder on a biopsy of a peripancreatic area. PET scan showed abnormal activity in some lymph nodes and an axillary lymph node biopsy has been requested by the oncologist. A biopsy of his bone marrow was negative. He is not having any axillary symptoms.  Past history family history review of systems are within the electronic medical record, reviewed but not dictated to this note.  EXAM: Vital signs: BP 136/76  Pulse 61  Temp(Src) 98 F (36.7 C)  Resp 18  Ht 5' 11.5" (1.816 m)  Wt 143 lb (64.864 kg)  BMI 19.67 kg/m2  General: Patient alert, oriented, NAD  Lungs: Clear to auscultation no respiratory problems Heart: Regular rhythm no murmurs rubs or gallops Abdomen: Soft and benign. No masses, no organomegaly.  Lymphatics: I do not appreciate any cervical adenopathy. He has a firm lymph node in the right axilla that is relatively low accessible. He also has some higher lymph nodes in the left axilla that are palpable.  Data reviewed: I reviewed the prior notes and laboratory studies. I discussed his PET scan with the radiologist. He does have activity in both axillas consistent with malignant activity. IMP: lymphoma  PLAN: I reviewed the indications risks and complications of axillary lymph node biopsy. We'll try to get this set up for him at his convenience. He is already off his Plavix. He understands this as an outpatient biopsy which involves removal of an entire lymph node.  Brandon Cisneros 10/08/2012

## 2012-10-12 NOTE — Op Note (Signed)
Operative Note  Brandon Cisneros male 79 y.o. 10/12/2012  PREOPERATIVE DX:  Lymphoproliferative process.  POSTOPERATIVE DX:  Same  PROCEDURE:  Excision of deep right axillary lymph nodes.         Surgeon: Adolph Pollack   Assistants: None  Anesthesia: General LMA anesthesia  Indications:  This is an 79 year old male with a mass in the head of the pancreas. On fine-needle aspiration a lymphoproliferative process was noted. A PET scan demonstrates multiple enlarged lymph nodes. One is seen in the right axilla and this is palpable. Both Dr. Jamey Ripa and  I saw him in the office and now he presents for excisional biopsy/removal of the enlarged right axilla lymph node.    Procedure Detail:  He was seen in the holding area and the right axillary area marked by initial. He is brought to the operating room, placed upon the operating table, and given general anesthesia via a LMA.  The hair in the right axilla was clipped and then the area was sterilely prepped and draped.  A small transverse incision was made in the right axilla. The subcutaneous tissue was dissected down into the deep axilla. I was able to palpate enlarged enlarged lymph nodes. I brought two of these up into the wound. Using electrocautery I excised both of these. One appeared to be necrotic. These were sent to pathology in saline.  The wound was inspected and bleeding points were controlled electrocautery. At the same Marcaine solution was injected into the wound for local anesthetic affect. Once hemostasis was adequate, the subcutaneous tissues approximated with interrupted 3-0 Vicryl sutures. The skin was closed with a running 4 Monocryl subcuticular stitch. Steri-Strips and sterile dressing were applied.  He tolerated the procedure well without any apparent complications and was taken to recovery in satisfactory condition.  Estimated Blood Loss:  less than 100 mL         Drains: none  Blood Given: none           Specimens: Deep right axillary lymph node the        Complications:  * No complications entered in OR log *         Disposition: PACU - hemodynamically stable.         Condition: stable

## 2012-10-12 NOTE — Anesthesia Preprocedure Evaluation (Addendum)
Anesthesia Evaluation  Patient identified by MRN, date of birth, ID band Patient awake    Reviewed: Allergy & Precautions, H&P , NPO status , Patient's Chart, lab work & pertinent test results  Airway Mallampati: II TM Distance: >3 FB Neck ROM: full    Dental  (+) Edentulous Upper and Edentulous Lower   Pulmonary shortness of breath and with exertion, sleep apnea , COPD breath sounds clear to auscultation  Pulmonary exam normal       Cardiovascular hypertension, Pt. on medications + Past MI and + CABG + dysrhythmias Atrial Fibrillation Rhythm:regular Rate:Normal  CABG 200   Neuro/Psych TIA 5 years ago TIAnegative psych ROS   GI/Hepatic negative GI ROS, Neg liver ROS,   Endo/Other  negative endocrine ROSHypothyroidism   Renal/GU negative Renal ROS  negative genitourinary   Musculoskeletal   Abdominal   Peds  Hematology negative hematology ROS (+)   Anesthesia Other Findings   Reproductive/Obstetrics negative OB ROS                          Anesthesia Physical Anesthesia Plan  ASA: III  Anesthesia Plan: General   Post-op Pain Management:    Induction: Intravenous  Airway Management Planned: LMA  Additional Equipment:   Intra-op Plan:   Post-operative Plan:   Informed Consent: I have reviewed the patients History and Physical, chart, labs and discussed the procedure including the risks, benefits and alternatives for the proposed anesthesia with the patient or authorized representative who has indicated his/her understanding and acceptance.   Dental Advisory Given  Plan Discussed with: CRNA and Surgeon  Anesthesia Plan Comments:         Anesthesia Quick Evaluation

## 2012-10-12 NOTE — Transfer of Care (Signed)
Immediate Anesthesia Transfer of Care Note  Patient: Brandon Cisneros  Procedure(s) Performed: Procedure(s): EXCISION RIGHT  AXILLARY LYMPH NODES (Right)  Patient Location: PACU  Anesthesia Type:General  Level of Consciousness: awake, alert  and oriented  Airway & Oxygen Therapy: Patient Spontanous Breathing and Patient connected to face mask oxygen  Post-op Assessment: Report given to PACU RN  Post vital signs: Reviewed and stable  Complications: No apparent anesthesia complications

## 2012-10-12 NOTE — Interval H&P Note (Signed)
History and Physical Interval Note:  10/12/2012 10:20 AM  Brandon Cisneros  has presented today for surgery, with the diagnosis of  lymphoma.  I have seen and examined him.   The various methods of treatment have been discussed with the patient and family. After consideration of risks, benefits and other options for treatment, the patient has consented to  Procedure(s): removal right AXILLARY LYMPH NODE  (Right) as a surgical intervention .  The patient's history has been reviewed, patient examined, no change in status, stable for surgery.  I have reviewed the patient's chart and labs.  Questions were answered to the patient's satisfaction.     Brandon Cisneros Shela Commons

## 2012-10-12 NOTE — Anesthesia Procedure Notes (Addendum)
Performed by: Hulan Fess   Procedure Name: LMA Insertion Date/Time: 10/12/2012 10:38 AM Performed by: Hulan Fess Pre-anesthesia Checklist: Patient identified, Emergency Drugs available, Suction available, Patient being monitored and Timeout performed Patient Re-evaluated:Patient Re-evaluated prior to inductionOxygen Delivery Method: Circle system utilized Preoxygenation: Pre-oxygenation with 100% oxygen Intubation Type: IV induction Ventilation: Mask ventilation without difficulty LMA: LMA with gastric port inserted LMA Size: 4.0

## 2012-10-12 NOTE — Telephone Encounter (Signed)
gv and printed appt sched and avs for pt  °

## 2012-10-13 ENCOUNTER — Encounter (HOSPITAL_COMMUNITY): Payer: Self-pay | Admitting: General Surgery

## 2012-10-19 ENCOUNTER — Encounter (INDEPENDENT_AMBULATORY_CARE_PROVIDER_SITE_OTHER): Payer: Self-pay | Admitting: General Surgery

## 2012-10-19 NOTE — Progress Notes (Signed)
Patient ID: Brandon Cisneros, male   DOB: 1927/02/28, 79 y.o.   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 I discussed this with his daughter since he is hard of hearing. He has a little bit of soreness in the right axillary area but no evidence of infection. He is due to see the oncologist on September 5.

## 2012-10-22 ENCOUNTER — Ambulatory Visit (HOSPITAL_BASED_OUTPATIENT_CLINIC_OR_DEPARTMENT_OTHER): Payer: Medicare Other | Admitting: Hematology and Oncology

## 2012-10-22 ENCOUNTER — Ambulatory Visit: Payer: Medicare Other

## 2012-10-22 ENCOUNTER — Telehealth: Payer: Self-pay | Admitting: Hematology and Oncology

## 2012-10-22 ENCOUNTER — Telehealth (INDEPENDENT_AMBULATORY_CARE_PROVIDER_SITE_OTHER): Payer: Self-pay | Admitting: *Deleted

## 2012-10-22 VITALS — BP 104/63 | HR 66 | Temp 97.3°F | Resp 19 | Ht 71.0 in | Wt 146.5 lb

## 2012-10-22 DIAGNOSIS — C8330 Diffuse large B-cell lymphoma, unspecified site: Secondary | ICD-10-CM

## 2012-10-22 DIAGNOSIS — C8299 Follicular lymphoma, unspecified, extranodal and solid organ sites: Secondary | ICD-10-CM

## 2012-10-22 DIAGNOSIS — C8589 Other specified types of non-Hodgkin lymphoma, extranodal and solid organ sites: Secondary | ICD-10-CM

## 2012-10-22 DIAGNOSIS — C8590 Non-Hodgkin lymphoma, unspecified, unspecified site: Secondary | ICD-10-CM

## 2012-10-22 DIAGNOSIS — C8290 Follicular lymphoma, unspecified, unspecified site: Secondary | ICD-10-CM

## 2012-10-22 NOTE — Telephone Encounter (Signed)
Off Plavix for 7 days.

## 2012-10-22 NOTE — Telephone Encounter (Signed)
Gave pt appt for lab, Md and chemo class, called Dr. Abbey Chatters and left message also for his nurse regarding pt taking Plavix, appt made for 9/15  ok per Cameo RN, pt will come by again after chemo class to get new appt, emailed Marcelino Duster regarding chemo aoot on 9/15

## 2012-10-22 NOTE — Telephone Encounter (Signed)
Received a call from Dr. Felecia Shelling office regarding having PAC inserted into patient so he can begin chemo.  They also wanted to make sure Dr. Abbey Chatters is aware that patient is on Plavix.  She was asking me about when he should stop the Plavix however there are no orders or notes advising me of this so explained that I as a nurse cannot advise when the Plavix would need to be stopped.  They state understanding that I will send a message on to Dr. Abbey Chatters to make him aware and allow him to make a decision.

## 2012-10-25 ENCOUNTER — Other Ambulatory Visit (INDEPENDENT_AMBULATORY_CARE_PROVIDER_SITE_OTHER): Payer: Self-pay | Admitting: General Surgery

## 2012-10-25 ENCOUNTER — Encounter (HOSPITAL_COMMUNITY): Payer: Self-pay | Admitting: Pharmacy Technician

## 2012-10-25 ENCOUNTER — Telehealth (INDEPENDENT_AMBULATORY_CARE_PROVIDER_SITE_OTHER): Payer: Self-pay

## 2012-10-25 ENCOUNTER — Telehealth: Payer: Self-pay | Admitting: *Deleted

## 2012-10-25 NOTE — Telephone Encounter (Signed)
I do not see where orders have been placed for PAC to be inserted.  Can these orders go ahead and be placed or will patient need to be seen?

## 2012-10-25 NOTE — Telephone Encounter (Signed)
Orders are in EPIC and have been sent to the schedulers.  So, you can let her know we are working on it.  He will need to be off all blood thinners, including aspirin, for 5-7 days.

## 2012-10-25 NOTE — Telephone Encounter (Signed)
Per staff message and POF I have scheduled appts.  JMW  

## 2012-10-25 NOTE — Telephone Encounter (Signed)
Look again.

## 2012-10-25 NOTE — Telephone Encounter (Signed)
Patient daughter is stating her dad is needing a port a cath placement ASAP  per Dr. Roanna Raider  please advise

## 2012-10-26 ENCOUNTER — Other Ambulatory Visit: Payer: Medicare Other | Admitting: Lab

## 2012-10-26 ENCOUNTER — Other Ambulatory Visit: Payer: Medicare Other

## 2012-10-26 ENCOUNTER — Encounter: Payer: Self-pay | Admitting: *Deleted

## 2012-10-27 ENCOUNTER — Telehealth: Payer: Self-pay | Admitting: *Deleted

## 2012-10-27 ENCOUNTER — Telehealth (INDEPENDENT_AMBULATORY_CARE_PROVIDER_SITE_OTHER): Payer: Self-pay

## 2012-10-27 ENCOUNTER — Encounter (HOSPITAL_COMMUNITY): Payer: Self-pay | Admitting: *Deleted

## 2012-10-27 MED ORDER — CEFAZOLIN SODIUM-DEXTROSE 2-3 GM-% IV SOLR
2.0000 g | INTRAVENOUS | Status: AC
  Administered 2012-10-28: 2 g via INTRAVENOUS

## 2012-10-27 NOTE — Telephone Encounter (Signed)
Daughter was advised to call Dr. Abbey Chatters to ask if patient can eat before  [facility] cath surgery scheduled for 10/28/12@3 :35 , Dr Abbey Chatters was not available  but Dr. Carolynne Edouard advised patient can have clear liquids early, before 7am, NPO 8 hrs before surgery. Daughter verbalized understanding

## 2012-10-27 NOTE — Progress Notes (Signed)
Pt's daughter is going to check with Dr. Maris Berger office to see if pt can have something light to eat early in the am because his surgery is not until 3:50 pm. She was instructed by me to have him NPO after midnight. She is concerned that he would not be able to tolerate going that long without anything to eat or drink.

## 2012-10-27 NOTE — Progress Notes (Signed)
ID: Pilar Plate OB: [birth year]  MR#: [id number]  WUJ#:[id number]  [facility]  [phone] [phone]   OFFICE PROGRESS NOTE  PCP: Corky Downs, MD  DIAGNOSIS: DLBCL   HISTORY OF PRESENT ILLNESS: The patient is a 79 y.o.man who initially developed diffuse pain for 4 days 6 weeks ago.He was seen by PCP who arranged abdominal U/S and chest x-ray. Abdominal ultrasound showed a density in pancreatic head. The patient had MRI scan which was positive for greater then 3 cm mass in pancreatic head. He had upper EUS with FNA of pancreatic mass. 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 FNA material consists of lymphoid appearing tissue characterized by relative abundance of large lymphoid cells associated with necrosis/apoptosis. Material for flow cytometric analysis is not available for analysis.  Immunohistochemical stains were performed on a somewhat limited cell block material. The stains include LCA, CD20, CD79a, CD3, CD43, BCL-2, CD10 and BCL-6 with appropriate controls. The large lymphoid appearing cells are positive for LCA, CD79a, BCL-2, BCL-6 and CD20 (partial). This is admixed with a minor population of T cells as seen with CD3 and CD43. The features are most compatible with high grade B cell  lymphoma. Large cell type is favored. The patient and family was seen first time in clinic on  09/15/2012.  We discussed NCCN guidelines recommendations for initial work up for presumable DLBCL.  We decided to proceed with PET/CT then excisional LN biopsy. bone marrow biopsy wi CBC, uric acid, LDH, Comprehensive metabolic panel, hepatitis B testing.    INTERVAL HISTORY: The patient presented today for follow up visit. His appetite is better. He feels tired. He has nasal congestion. Dyspnea not worse than it has been before. The patient denied fever, chills, night sweats. He denied headaches, double vision, blurry vision, nasal  discharge, hearing problems, odynophagia or dysphagia. No chest pain, palpitations, cough, abdominal pain, nausea, vomiting, diarrhea, constipation, hematochezia. The patient denied dysuria, nocturia, polyuria, hematuria, myalgia, numbness, tingling, psychiatric problems  .Review of Systems  Constitutional: Positive for malaise/fatigue. Negative for fever, chills, weight loss and diaphoresis.  HENT: Positive for congestion. Negative for hearing loss, nosebleeds, sore throat, neck pain and tinnitus.   Eyes: Negative for blurred vision, double vision, photophobia and pain.  Respiratory: Positive for shortness of breath. Negative for cough, hemoptysis, sputum production, wheezing and stridor.   Cardiovascular: Negative for chest pain, palpitations, orthopnea, claudication, leg swelling and PND.  Gastrointestinal: Negative for heartburn, nausea, vomiting, abdominal pain, diarrhea, constipation, blood in stool and melena.  Genitourinary: Negative for dysuria, urgency, frequency, hematuria and flank pain.  Musculoskeletal: Negative for myalgias, back pain, joint pain and falls.  Skin: Negative for itching and rash.  Neurological: Negative for dizziness, tingling, tremors, sensory change, speech change, focal weakness, seizures, loss of consciousness, weakness and headaches.  Endo/Heme/Allergies: Does not bruise/bleed easily.  Psychiatric/Behavioral: Negative.     PAST MEDICAL HISTORY: Past Medical History  Diagnosis Date  . Hypertension   . GERD (gastroesophageal reflux disease)   . Cancer 2011    prostate  . Dysrhythmia     afib  . Myocardial infarction 2000  . COPD (chronic obstructive pulmonary disease)   . Bronchitis 2 months ago  . TIA (transient ischemic attack) 5 years ago    questionable  . Sleep apnea     no cpap used  . Reflux esophagitis   . Hyperlipidemia   . Hypothyroidism     no current thryoid meds  . Anxiety  occasional  . Shortness of breath     PAST SURGICAL  HISTORY: Past Surgical History  Procedure Laterality Date  . Radioactive seed implants  2011  . Hernia repair  2012  . Coronary artery bypass graft  2000    x 1  . Back surgery  1997    upper back  . Eus N/A 09/01/2012    Procedure: ESOPHAGEAL ENDOSCOPIC ULTRASOUND (EUS) RADIAL;  Surgeon: Willis Modena, MD;  Location: [facility];  Service: Endoscopy;  Laterality: N/A;  . Fine needle aspiration N/A 09/01/2012    Procedure: FINE NEEDLE ASPIRATION (FNA) LINEAR;  Surgeon: Willis Modena, MD;  Location: [facility];  Service: Endoscopy;  Laterality: N/A;  +or- fna  . Axillary lymph node biopsy Right 10/12/2012    Procedure: EXCISION RIGHT  AXILLARY LYMPH NODES;  Surgeon: Adolph Pollack, MD;  Location: [facility];  Service: General;  Laterality: Right;    FAMILY HISTORY Family History  Problem Relation Age of Onset  . Heart disease Mother   . Cancer Father     prostate ca  . Alzheimer's disease Sister   . Cancer Sister     lung  . Cancer Sister     liver ca    HEALTH MAINTENANCE: History  Substance Use Topics  . Smoking status: Current Every Day Smoker -- 0.25 packs/day for 75 years    Types: Cigarettes  . Smokeless tobacco: Never Used     Comment: Light smoker  . Alcohol Use: No    Allergies  Allergen Reactions  . Hydrocodone Itching    Current Outpatient Prescriptions  Medication Sig Dispense Refill  . amiodarone (PACERONE) 200 MG tablet Take 100 mg by mouth every morning.      Marland Kitchen aspirin 81 MG tablet Take 81 mg by mouth every morning.       . clopidogrel (PLAVIX) 75 MG tablet Take 75 mg by mouth daily.      . enalapril (VASOTEC) 5 MG tablet Take 2.5 mg by mouth every morning.      . Ibuprofen-Diphenhydramine Cit (IBUPROFEN PM PO) Take 1 tablet by mouth at bedtime as needed (for sleep).      . LORazepam (ATIVAN) 0.5 MG tablet Take 0.5 mg by mouth every 8 (eight) hours as needed for anxiety.       . pantoprazole (PROTONIX) 40 MG tablet Take 40 mg by mouth at bedtime.       . simvastatin (ZOCOR) 20 MG tablet Take 20 mg by mouth every evening.      . tamsulosin (FLOMAX) 0.4 MG CAPS Take 0.4 mg by mouth daily.       No current facility-administered medications for this visit.    OBJECTIVE: Filed Vitals:   10/22/12 1445  BP: 104/63  Pulse: 66  Temp: 97.3 F (36.3 C)  Resp: 19     Body mass index is 20.44 kg/(m^2).    ECOG FS: 2  PHYSICAL EXAMINATION:  HEENT: Sclerae anicteric.  Conjunctivae were pink. Pupils round and reactive bilaterally. Oral mucosa is moist without ulceration or thrush. No occipital, submandibular, cervical, supraclavicular or axillar adenopathy. Lungs: clear to auscultation without wheezes. No rales or rhonchi. Heart: regular rate and rhythm. No murmur, gallop or rubs. Abdomen: soft, non tender. No guarding or rebound tenderness. Bowel sounds are present. No palpable hepatosplenomegaly. MSK: no focal spinal tenderness. Extremities: No clubbing or cyanosis.No calf tenderness to palpitation, no peripheral edema.  Skin exam was without ecchymosis, petechiae. Neuro: non-focal, alert and oriented to time, person  and place, appropriate affect  LAB RESULTS:  CMP     Component Value Date/Time   NA 133* 10/11/2012 1345   NA 134* 09/20/2012 1255   K 5.1 10/11/2012 1345   K 4.6 09/20/2012 1255   CL 102 10/11/2012 1345   CO2 25 10/11/2012 1345   CO2 21* 09/20/2012 1255   GLUCOSE 82 10/11/2012 1345   GLUCOSE 81 09/20/2012 1255   BUN 11 10/11/2012 1345   BUN 12.9 09/20/2012 1255   CREATININE 1.41* 10/11/2012 1345   CREATININE 1.5* 09/20/2012 1255   CALCIUM 9.0 10/11/2012 1345   CALCIUM 8.9 09/20/2012 1255   PROT 6.7 09/20/2012 1255   ALBUMIN 3.3* 09/20/2012 1255   AST 15 09/20/2012 1255   ALT 7 09/20/2012 1255   ALKPHOS 57 09/20/2012 1255   BILITOT 0.68 09/20/2012 1255   GFRNONAA 45* 10/11/2012 1345   GFRAA 52* 10/11/2012 1345    Lab Results  Component Value Date   WBC 10.9* 10/11/2012   NEUTROABS 5.0 09/20/2012   HGB 12.4* 10/11/2012   HCT 36.8* 10/11/2012     MCV 87.6 10/11/2012   PLT 326 10/11/2012      Chemistry      Component Value Date/Time   NA 133* 10/11/2012 1345   NA 134* 09/20/2012 1255   K 5.1 10/11/2012 1345   K 4.6 09/20/2012 1255   CL 102 10/11/2012 1345   CO2 25 10/11/2012 1345   CO2 21* 09/20/2012 1255   BUN 11 10/11/2012 1345   BUN 12.9 09/20/2012 1255   CREATININE 1.41* 10/11/2012 1345   CREATININE 1.5* 09/20/2012 1255      Component Value Date/Time   CALCIUM 9.0 10/11/2012 1345   CALCIUM 8.9 09/20/2012 1255   ALKPHOS 57 09/20/2012 1255   AST 15 09/20/2012 1255   ALT 7 09/20/2012 1255   BILITOT 0.68 09/20/2012 1255      STUDIES: Ct Biopsy  09/30/2012   *RADIOLOGY REPORT*  Clinical history: History of B-cell lymphoproliferative disorder.   PROCEDURE(S): CT GUIDED BONE MARROW ASPIRATES AND BIOPSY  Physician: Rachelle Hora. Henn, MD   Medications: Versed 1 mg, Fentanyl 100 mcg. A radiology nurse monitored the patient for moderate sedation.  Sedation time:  10 minutes   Procedure: The procedure was explained to the patient. The risks and benefits of the procedure were discussed and the patient's questions were addressed.  Informed consent was obtained from the patient. The patient was placed prone on CT scan. Images of the pelvis were obtained. The right side of back was prepped and draped in sterile fashion. The skin and right posterior iliac bone were anesthetized with 1% lidocaine.   11 gauge bone needle was directed into the right iliac bone with CT guidance. Two aspirates and one core biopsy obtained.   Findings: Needle position confirmed within the posterior right iliac bone.  Complications: None   Impression: CT guided bone marrow aspirates and core biopsy.   Original Report Authenticated By: Richarda Overlie, M.D.    ASSESSMENT AND PLAN: 1. 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 Grade (if applicable): High grade (grade 3/3) Flow cytometry: Non-contributory ([id number]) Immunohistochemical stains: CD20, CD79a, CD3, CD43, CD10, BCL-6 and BCL-2 performed on block 1B with appropriate controls. Touch preps/imprints: Abundance of large lymphoid cells admixed with small lymphoid cells. Comments: The sections primarily  show soft fatty tissue densely and diffusely infiltrated by a relatively monomorphic population of large lymphoid cells with round to oval to multilobated nuclei, vesicular chromatin and prominent nucleoli. This is associated with brisk mitoses and apoptosis. Relatively intact lymph nodal tissue is seen adjacent to fatty tissue displaying variable number of atypical lymphoid follicles mostly characterized by a homogenous composition of predominately small angulated lymphoid cells. Occasional atypical lymphoid follicles have predominance of large lymphoid cells. To further evaluate this process, flow cytometric analysis was attempted but failed to show any monoclonal B cell population or abnormal T cell phenotype possibly related to sampling. Hence, immunohistochemical stains were performed and show that the large atypical lymphoid cells in the diffuse areas stain positively for B cell markers CD20 and CD79a in addition to positivity for BCL-6, BCL-2 and patchy positivity for CD10. The large lymphoid cells show an admixture with a minor component of T cells as seen with CD3 and CD43. The atypical follicular structures within the intact lymphoid tissue show positivity for B cell markers CD79a and CD20 in addition to positivity for CD10, BCL-2 and BCL-6. Given the overall histologic and immunophenotypic features, the finding are consistent with diffuse large B cell lymphoma arising in a background of follicular B cell lymphoma. The latter process consist of both low grade and focally high grade components)..  We  discussed treatment of DLBCL and NCCN recommendations and decided proceed with RCEOP treatment We arrange portacath placement, chemo teaching. I will see patient at day of first cycle.   Myra Rude, MD   10/27/2012 1:17 PM

## 2012-10-27 NOTE — Telephone Encounter (Signed)
No additional note

## 2012-10-28 ENCOUNTER — Encounter (HOSPITAL_COMMUNITY): Payer: Self-pay | Admitting: Anesthesiology

## 2012-10-28 ENCOUNTER — Telehealth: Payer: Self-pay | Admitting: Internal Medicine

## 2012-10-28 ENCOUNTER — Ambulatory Visit (HOSPITAL_COMMUNITY): Payer: Medicare Other

## 2012-10-28 ENCOUNTER — Encounter (HOSPITAL_COMMUNITY): Payer: Self-pay | Admitting: *Deleted

## 2012-10-28 ENCOUNTER — Ambulatory Visit (HOSPITAL_COMMUNITY): Payer: Medicare Other | Admitting: Anesthesiology

## 2012-10-28 ENCOUNTER — Encounter (HOSPITAL_COMMUNITY)
Admission: RE | Disposition: A | Discharge status: Home / Self Care | Payer: Self-pay | Source: Ambulatory Visit | Attending: General Surgery

## 2012-10-28 ENCOUNTER — Ambulatory Visit (HOSPITAL_COMMUNITY)
Admission: RE | Admit: 2012-10-28 | Discharge: 2012-10-28 | Disposition: A | Discharge status: Home / Self Care | Payer: Medicare Other | Source: Ambulatory Visit | Attending: General Surgery | Admitting: General Surgery

## 2012-10-28 DIAGNOSIS — Z8673 Personal history of transient ischemic attack (TIA), and cerebral infarction without residual deficits: Secondary | ICD-10-CM | POA: Insufficient documentation

## 2012-10-28 DIAGNOSIS — J4489 Other specified chronic obstructive pulmonary disease: Secondary | ICD-10-CM | POA: Insufficient documentation

## 2012-10-28 DIAGNOSIS — J449 Chronic obstructive pulmonary disease, unspecified: Secondary | ICD-10-CM | POA: Insufficient documentation

## 2012-10-28 DIAGNOSIS — C8584 Other specified types of non-Hodgkin lymphoma, lymph nodes of axilla and upper limb: Secondary | ICD-10-CM

## 2012-10-28 DIAGNOSIS — I10 Essential (primary) hypertension: Secondary | ICD-10-CM | POA: Insufficient documentation

## 2012-10-28 DIAGNOSIS — K219 Gastro-esophageal reflux disease without esophagitis: Secondary | ICD-10-CM | POA: Insufficient documentation

## 2012-10-28 DIAGNOSIS — C8589 Other specified types of non-Hodgkin lymphoma, extranodal and solid organ sites: Secondary | ICD-10-CM | POA: Insufficient documentation

## 2012-10-28 DIAGNOSIS — I252 Old myocardial infarction: Secondary | ICD-10-CM | POA: Insufficient documentation

## 2012-10-28 HISTORY — DX: Personal history of other diseases of the digestive system: Z87.19

## 2012-10-28 HISTORY — DX: Cerebral infarction, unspecified: I63.9

## 2012-10-28 HISTORY — PX: PORTACATH PLACEMENT: SHX2246

## 2012-10-28 HISTORY — DX: Family history of other specified conditions: Z84.89

## 2012-10-28 HISTORY — DX: Adverse effect of unspecified anesthetic, initial encounter: T41.45XA

## 2012-10-28 HISTORY — DX: Zoster without complications: B02.9

## 2012-10-28 HISTORY — DX: Chronic kidney disease, unspecified: N18.9

## 2012-10-28 HISTORY — DX: Other complications of anesthesia, initial encounter: T88.59XA

## 2012-10-28 LAB — CBC
MCH: 29.5 pg (ref 26.0–34.0)
MCV: 86.9 fL (ref 78.0–100.0)
Platelets: 272 10*3/uL (ref 150–400)
RDW: 14.9 % (ref 11.5–15.5)
WBC: 10.1 10*3/uL (ref 4.0–10.5)

## 2012-10-28 LAB — BASIC METABOLIC PANEL
Calcium: 9 mg/dL (ref 8.4–10.5)
Chloride: 100 mEq/L (ref 96–112)
Creatinine, Ser: 1.26 mg/dL (ref 0.50–1.35)
GFR calc Af Amer: 59 mL/min — ABNORMAL LOW (ref >90)

## 2012-10-28 SURGERY — INSERTION, TUNNELED CENTRAL VENOUS DEVICE, WITH PORT
Anesthesia: Monitor Anesthesia Care | Site: Neck | Wound class: Clean

## 2012-10-28 MED ORDER — FENTANYL CITRATE 0.05 MG/ML IJ SOLN: 25.0000 ug | INTRAMUSCULAR | Status: DC | PRN

## 2012-10-28 MED ORDER — LIDOCAINE HCL (PF) 1 % IJ SOLN
INTRAMUSCULAR | Status: AC
  Filled 2012-10-28: qty 30

## 2012-10-28 MED ORDER — HEPARIN SOD (PORK) LOCK FLUSH 100 UNIT/ML IV SOLN
INTRAVENOUS | Status: DC | PRN
  Administered 2012-10-28: 150 [IU] via INTRAVENOUS

## 2012-10-28 MED ORDER — FENTANYL CITRATE 0.05 MG/ML IJ SOLN
INTRAMUSCULAR | Status: DC | PRN
  Administered 2012-10-28 (×2): 50 ug via INTRAVENOUS
  Administered 2012-10-28: 100 ug via INTRAVENOUS

## 2012-10-28 MED ORDER — HEPARIN SOD (PORK) LOCK FLUSH 100 UNIT/ML IV SOLN
INTRAVENOUS | Status: AC
  Filled 2012-10-28: qty 5

## 2012-10-28 MED ORDER — SODIUM CHLORIDE 0.9 % IJ SOLN: 3.0000 mL | INTRAMUSCULAR | Status: DC | PRN

## 2012-10-28 MED ORDER — LACTATED RINGERS IV SOLN
INTRAVENOUS | Status: DC
  Administered 2012-10-28: 14:00:00 via INTRAVENOUS

## 2012-10-28 MED ORDER — ACETAMINOPHEN 500 MG PO TABS: 1000.0000 mg | ORAL_TABLET | Freq: Four times a day (QID) | ORAL | Status: DC

## 2012-10-28 MED ORDER — METOCLOPRAMIDE HCL 5 MG/ML IJ SOLN: 10.0000 mg | Freq: Once | INTRAMUSCULAR | Status: DC | PRN

## 2012-10-28 MED ORDER — ONDANSETRON HCL 4 MG/2ML IJ SOLN: 4.0000 mg | Freq: Four times a day (QID) | INTRAMUSCULAR | Status: DC | PRN

## 2012-10-28 MED ORDER — ACETAMINOPHEN 325 MG PO TABS: 650.0000 mg | ORAL_TABLET | ORAL | Status: DC | PRN

## 2012-10-28 MED ORDER — SODIUM CHLORIDE 0.9 % IR SOLN
Status: DC | PRN
  Administered 2012-10-28: 16:00:00

## 2012-10-28 MED ORDER — CEFAZOLIN SODIUM 1-5 GM-% IV SOLN
INTRAVENOUS | Status: AC
  Filled 2012-10-28: qty 100

## 2012-10-28 MED ORDER — LIDOCAINE HCL (CARDIAC) 20 MG/ML IV SOLN
INTRAVENOUS | Status: DC | PRN
  Administered 2012-10-28: 60 mg via INTRAVENOUS

## 2012-10-28 MED ORDER — LIDOCAINE HCL (PF) 1 % IJ SOLN
INTRAMUSCULAR | Status: DC | PRN
  Administered 2012-10-28: 22 mL via INTRADERMAL

## 2012-10-28 MED ORDER — PROPOFOL 10 MG/ML IV BOLUS
INTRAVENOUS | Status: DC | PRN
  Administered 2012-10-28 (×2): 20 mg via INTRAVENOUS

## 2012-10-28 MED ORDER — LACTATED RINGERS IV SOLN
INTRAVENOUS | Status: DC | PRN
  Administered 2012-10-28: 16:00:00 via INTRAVENOUS

## 2012-10-28 MED ORDER — ACETAMINOPHEN 650 MG RE SUPP: 650.0000 mg | RECTAL | Status: DC | PRN

## 2012-10-28 SURGICAL SUPPLY — 57 items
APL SKNCLS STERI-STRIP NONHPOA (GAUZE/BANDAGES/DRESSINGS) ×1
BAG DECANTER FOR FLEXI CONT (MISCELLANEOUS) ×2 IMPLANT
BENZOIN TINCTURE PRP APPL 2/3 (GAUZE/BANDAGES/DRESSINGS) ×2 IMPLANT
BLADE SURG 11 STRL SS (BLADE) ×2 IMPLANT
BLADE SURG 15 STRL LF DISP TIS (BLADE) ×1 IMPLANT
BLADE SURG 15 STRL SS (BLADE) ×2
CHLORAPREP W/TINT 26ML (MISCELLANEOUS) ×2 IMPLANT
CLOTH BEACON ORANGE TIMEOUT ST (SAFETY) ×2 IMPLANT
COVER SURGICAL LIGHT HANDLE (MISCELLANEOUS) ×2 IMPLANT
COVER TRANSDUCER ULTRASND GEL (DRAPE) ×2 IMPLANT
CRADLE DONUT ADULT HEAD (MISCELLANEOUS) ×2 IMPLANT
DECANTER SPIKE VIAL GLASS SM (MISCELLANEOUS) ×2 IMPLANT
DRAPE C-ARM 42X72 X-RAY (DRAPES) ×2 IMPLANT
DRAPE LAPAROSCOPIC ABDOMINAL (DRAPES) ×2 IMPLANT
DRAPE UTILITY 15X26 W/TAPE STR (DRAPE) ×4 IMPLANT
DRSG TEGADERM 2-3/8X2-3/4 SM (GAUZE/BANDAGES/DRESSINGS) ×1 IMPLANT
DRSG TEGADERM 4X4.75 (GAUZE/BANDAGES/DRESSINGS) ×1 IMPLANT
ELECT CAUTERY BLADE 6.4 (BLADE) ×2 IMPLANT
ELECT REM PT RETURN 9FT ADLT (ELECTROSURGICAL) ×2
ELECTRODE REM PT RTRN 9FT ADLT (ELECTROSURGICAL) ×1 IMPLANT
GAUZE SPONGE 2X2 8PLY STRL LF (GAUZE/BANDAGES/DRESSINGS) ×1 IMPLANT
GAUZE SPONGE 4X4 16PLY XRAY LF (GAUZE/BANDAGES/DRESSINGS) ×2 IMPLANT
GLOVE BIOGEL PI IND STRL 8 (GLOVE) ×1 IMPLANT
GLOVE BIOGEL PI INDICATOR 8 (GLOVE) ×1
GLOVE ECLIPSE 8.0 STRL XLNG CF (GLOVE) ×2 IMPLANT
GOWN STRL NON-REIN LRG LVL3 (GOWN DISPOSABLE) ×4 IMPLANT
INTRODUCER 13FR (MISCELLANEOUS) IMPLANT
INTRODUCER COOK 11FR (CATHETERS) IMPLANT
KIT BASIN OR (CUSTOM PROCEDURE TRAY) ×2 IMPLANT
KIT PORT POWER 8FR ISP CVUE (Catheter) ×1 IMPLANT
KIT PORT POWER 9.6FR MRI PREA (Catheter) IMPLANT
KIT PORT POWER ISP 8FR (Catheter) IMPLANT
KIT POWER CATH 8FR (Catheter) IMPLANT
KIT ROOM TURNOVER OR (KITS) ×2 IMPLANT
NDL HYPO 25GX1X1/2 BEV (NEEDLE) ×1 IMPLANT
NEEDLE 22X1 1/2 (OR ONLY) (NEEDLE) ×2 IMPLANT
NEEDLE HYPO 25GX1X1/2 BEV (NEEDLE) ×2 IMPLANT
NS IRRIG 1000ML POUR BTL (IV SOLUTION) ×2 IMPLANT
PACK SURGICAL SETUP 50X90 (CUSTOM PROCEDURE TRAY) ×2 IMPLANT
PAD ARMBOARD 7.5X6 YLW CONV (MISCELLANEOUS) ×4 IMPLANT
PENCIL BUTTON HOLSTER BLD 10FT (ELECTRODE) ×2 IMPLANT
SET INTRODUCER 12FR PACEMAKER (SHEATH) IMPLANT
SET SHEATH INTRODUCER 10FR (MISCELLANEOUS) IMPLANT
SHEATH COOK PEEL AWAY SET 9F (SHEATH) IMPLANT
SPONGE GAUZE 2X2 STER 10/PKG (GAUZE/BANDAGES/DRESSINGS) ×1
STRIP CLOSURE SKIN 1/2X4 (GAUZE/BANDAGES/DRESSINGS) ×1 IMPLANT
STRIP CLOSURE SKIN 1/4X4 (GAUZE/BANDAGES/DRESSINGS) ×2 IMPLANT
SUT MON AB 4-0 PC3 18 (SUTURE) ×2 IMPLANT
SUT VIC AB 2-0 SH 18 (SUTURE) ×2 IMPLANT
SUT VIC AB 3-0 SH 27 (SUTURE)
SUT VIC AB 3-0 SH 27X BRD (SUTURE) IMPLANT
SYR 20ML ECCENTRIC (SYRINGE) ×4 IMPLANT
SYR 5ML LUER SLIP (SYRINGE) ×2 IMPLANT
SYR CONTROL 10ML LL (SYRINGE) ×2 IMPLANT
TOWEL OR 17X24 6PK STRL BLUE (TOWEL DISPOSABLE) ×2 IMPLANT
TOWEL OR 17X26 10 PK STRL BLUE (TOWEL DISPOSABLE) ×2 IMPLANT
WATER STERILE IRR 1000ML POUR (IV SOLUTION) IMPLANT

## 2012-10-28 NOTE — H&P (Signed)
Brandon Cisneros is an 79 y.o. male.   Chief Complaint:   Here for Port-a-cath insertion HPI: He has been recently diagnosed with Lymphoma and needs long term venous access for chemotherapy.  He presents for that.  Past Medical History  Diagnosis Date  . Hypertension   . GERD (gastroesophageal reflux disease)   . Cancer 2011    prostate  . Dysrhythmia     afib  . Myocardial infarction 2000  . COPD (chronic obstructive pulmonary disease)   . Bronchitis 2 months ago  . TIA (transient ischemic attack) 5 years ago    questionable  . Sleep apnea     no cpap used  . Reflux esophagitis   . Hyperlipidemia   . Hypothyroidism     no current thryoid meds  . Anxiety     occasional  . Shortness of breath   . Complication of anesthesia     went into ?A-fib  . Family history of anesthesia complication     daughter has n/v after anesthesia  . Stroke     TIA  . Chronic kidney disease     kidney stones  . H/O hiatal hernia   . Shingles     Past Surgical History  Procedure Laterality Date  . Radioactive seed implants  2011  . Hernia repair  2012  . Coronary artery bypass graft  2000    x 1  . Back surgery  1997    upper back  . Eus N/A 09/01/2012    Procedure: ESOPHAGEAL ENDOSCOPIC ULTRASOUND (EUS) RADIAL;  Surgeon: Willis Modena, MD;  Location: [facility];  Service: Endoscopy;  Laterality: N/A;  . Fine needle aspiration N/A 09/01/2012    Procedure: FINE NEEDLE ASPIRATION (FNA) LINEAR;  Surgeon: Willis Modena, MD;  Location: [facility];  Service: Endoscopy;  Laterality: N/A;  +or- fna  . Axillary lymph node biopsy Right 10/12/2012    Procedure: EXCISION RIGHT  AXILLARY LYMPH NODES;  Surgeon: Adolph Pollack, MD;  Location: [facility];  Service: General;  Laterality: Right;  . Cardiac catheterization    . Eye surgery Bilateral 20 yrs    cataracts    Family History  Problem Relation Age of Onset  . Heart disease Mother   . Cancer Father     prostate ca  . Alzheimer's disease  Sister   . Cancer Sister     lung  . Cancer Sister     liver ca   Social History:  reports that he has been smoking Cigarettes.  He has a 18.75 pack-year smoking history. He has never used smokeless tobacco. He reports that he does not drink alcohol or use illicit drugs.  Allergies:  Allergies  Allergen Reactions  . Hydrocodone Itching   Prior to Admission medications   Medication Sig Start Date End Date Taking? Authorizing Provider  amiodarone (PACERONE) 200 MG tablet Take 100 mg by mouth every morning.   Yes Historical Provider, MD  enalapril (VASOTEC) 5 MG tablet Take 2.5 mg by mouth every morning.   Yes Historical Provider, MD  Ibuprofen-Diphenhydramine Cit (IBUPROFEN PM PO) Take 1 tablet by mouth at bedtime as needed (for sleep).   Yes Historical Provider, MD  LORazepam (ATIVAN) 0.5 MG tablet Take 0.5 mg by mouth every 8 (eight) hours as needed for anxiety.    Yes Historical Provider, MD  pantoprazole (PROTONIX) 40 MG tablet Take 40 mg by mouth at bedtime.   Yes Historical Provider, MD  simvastatin (ZOCOR) 20 MG tablet Take  20 mg by mouth every evening.   Yes Historical Provider, MD  tamsulosin (FLOMAX) 0.4 MG CAPS Take 0.4 mg by mouth daily.   Yes Historical Provider, MD  aspirin 81 MG tablet Take 81 mg by mouth every morning.     Historical Provider, MD  clopidogrel (PLAVIX) 75 MG tablet Take 75 mg by mouth daily.    Historical Provider, MD     Medications Prior to Admission  Medication Sig Dispense Refill  . amiodarone (PACERONE) 200 MG tablet Take 100 mg by mouth every morning.      . enalapril (VASOTEC) 5 MG tablet Take 2.5 mg by mouth every morning.      . Ibuprofen-Diphenhydramine Cit (IBUPROFEN PM PO) Take 1 tablet by mouth at bedtime as needed (for sleep).      . LORazepam (ATIVAN) 0.5 MG tablet Take 0.5 mg by mouth every 8 (eight) hours as needed for anxiety.       . pantoprazole (PROTONIX) 40 MG tablet Take 40 mg by mouth at bedtime.      . simvastatin (ZOCOR) 20 MG  tablet Take 20 mg by mouth every evening.      . tamsulosin (FLOMAX) 0.4 MG CAPS Take 0.4 mg by mouth daily.      Marland Kitchen aspirin 81 MG tablet Take 81 mg by mouth every morning.       . clopidogrel (PLAVIX) 75 MG tablet Take 75 mg by mouth daily.        Results for orders placed during the hospital encounter of 10/28/12 (from the past 48 hour(s))  CBC     Status: Abnormal   Collection Time    10/28/12  1:51 PM      Result Value Range   WBC 10.1  4.0 - 10.5 K/uL   RBC 4.21 (*) 4.22 - 5.81 MIL/uL   Hemoglobin 12.4 (*) 13.0 - 17.0 g/dL   HCT 29.5 (*) 62.1 - 30.8 %   MCV 86.9  78.0 - 100.0 fL   MCH 29.5  26.0 - 34.0 pg   MCHC 33.9  30.0 - 36.0 g/dL   RDW 65.7  84.6 - 96.2 %   Platelets 272  150 - 400 K/uL   No results found.  Review of Systems  Constitutional: Negative for fever and chills.  Gastrointestinal: Negative for nausea and vomiting.  Musculoskeletal:       Right upper arm soreness.    Blood pressure 162/86, pulse 68, temperature 97 F (36.1 C), temperature source Oral, resp. rate 20, SpO2 100.00%. Physical Exam  Constitutional:  Thin, elderly male.  HENT:  Head: Normocephalic and atraumatic.  Cardiovascular:  Decreased rate.  Respiratory: Effort normal and breath sounds normal.  GI: Soft. He exhibits no mass. There is no tenderness.  Musculoskeletal:  Right axillary scar clean and intact with minimal swelling.  Lymphadenopathy:    He has no cervical adenopathy.  Neurological: He is alert.  Skin: Skin is warm and dry.     Assessment/Plan Lymphoma  Plan:  US guided Port-a-cath insertion.  The procedure risks and aftercare been explained. Risks include but are not limited to bleeding, infection, malfunction, pneumothorax, wound problems, DVT.  [name] J 10/28/2012, 2:40 PM

## 2012-10-28 NOTE — Anesthesia Postprocedure Evaluation (Signed)
Anesthesia Post Note  Patient: Brandon Cisneros  Procedure(s) Performed: Procedure(s) (LRB): INSERTION PORT-A-CATH (N/A)  Anesthesia type: MAC  Patient location: PACU  Post pain: Pain level controlled  Post assessment: Patient's Cardiovascular Status Stable  Last Vitals:  Filed Vitals:   10/28/12 1715  BP: 173/90  Pulse: 66  Temp:   Resp: 16    Post vital signs: Reviewed and stable  Level of consciousness: sedated  Complications: No apparent anesthesia complications

## 2012-10-28 NOTE — Anesthesia Preprocedure Evaluation (Addendum)
Anesthesia Evaluation  Patient identified by MRN, date of birth, ID band Patient awake    Reviewed: Allergy & Precautions, H&P , NPO status , Patient's Chart, lab work & pertinent test results, reviewed documented beta blocker date and time   History of Anesthesia Complications (+) PONV and Family history of anesthesia reaction  Airway Mallampati: II TM Distance: >3 FB Neck ROM: full    Dental  (+) Dental Advisory Given   Pulmonary shortness of breath and with exertion, sleep apnea , COPDCurrent Smoker,  breath sounds clear to auscultation        Cardiovascular hypertension, On Medications + Past MI and + CABG negative cardio ROS  + dysrhythmias Atrial Fibrillation Rhythm:regular     Neuro/Psych TIACVA negative neurological ROS  negative psych ROS   GI/Hepatic Neg liver ROS, hiatal hernia, GERD-  Medicated and Controlled,  Endo/Other  negative endocrine ROSHypothyroidism   Renal/GU Renal InsufficiencyRenal disease  negative genitourinary   Musculoskeletal   Abdominal   Peds  Hematology negative hematology ROS (+)   Anesthesia Other Findings See surgeon's H&P   Reproductive/Obstetrics negative OB ROS                          Anesthesia Physical Anesthesia Plan  ASA: III  Anesthesia Plan: MAC   Post-op Pain Management:    Induction: Intravenous  Airway Management Planned: Simple Face Mask  Additional Equipment:   Intra-op Plan:   Post-operative Plan:   Informed Consent: I have reviewed the patients History and Physical, chart, labs and discussed the procedure including the risks, benefits and alternatives for the proposed anesthesia with the patient or authorized representative who has indicated his/her understanding and acceptance.   Dental Advisory Given  Plan Discussed with: CRNA, Surgeon and Anesthesiologist  Anesthesia Plan Comments:        Anesthesia Quick  Evaluation

## 2012-10-28 NOTE — Progress Notes (Signed)
bmet was hemolyzed. Will redraw

## 2012-10-28 NOTE — Telephone Encounter (Signed)
Talked to pt's daughter they are aware of appts on 9/15

## 2012-10-28 NOTE — Transfer of Care (Signed)
Immediate Anesthesia Transfer of Care Note  Patient: Brandon Cisneros  Procedure(s) Performed: Procedure(s): INSERTION PORT-A-CATH (N/A)  Patient Location: PACU  Anesthesia Type:MAC  Level of Consciousness: awake, alert , oriented and patient cooperative  Airway & Oxygen Therapy: Patient Spontanous Breathing  Post-op Assessment: Report given to PACU RN, Post -op Vital signs reviewed and stable and Patient moving all extremities  Post vital signs: Reviewed and stable  Complications: No apparent anesthesia complications

## 2012-10-28 NOTE — Telephone Encounter (Signed)
Noted  

## 2012-10-28 NOTE — Interval H&P Note (Signed)
History and Physical Interval Note:  10/28/2012 2:52 PM  Brandon Cisneros  has presented today for surgery, with the diagnosis of LYMPHOMA   The various methods of treatment have been discussed with the patient and family. After consideration of risks, benefits and other options for treatment, the patient has consented to  Procedure(s): INSERTION PORT-A-CATH (N/A) as a surgical intervention .  The patient's history has been reviewed, patient examined, no change in status, stable for surgery.  I have reviewed the patient's chart and labs.  Questions were answered to the patient's satisfaction.     [name] Shela [place]

## 2012-10-28 NOTE — Op Note (Signed)
Operative Note  Brandon Cisneros male 79 y.o. 10/28/2012  PREOPERATIVE DX:  Lymphoma  POSTOPERATIVE DX:  Same  PROCEDURE:  Ultrasound-guided Port-A-Cath insertion into right subclavian vein with fluoroscopy         Surgeon: Adolph Pollack   Assistants: None  Anesthesia: Monitored Local Anesthesia with Sedation  Indications: This is an 79 year old male with recently diagnosed lymphoma. He is going to undergo chemotherapy. He needs long-term venous access and presents for Port-A-Cath insertion.    Procedure Detail:  He was brought to the operating room placed supine on the operating table and intravenous sedation was given. The upper chest and neck areas were sterilely prepped and draped. Using the ultrasound I inspected the right neck looking for the right internal jugular vein. The right carotid artery was identified. The vein appeared to be very flat. I tried to cannulate the vein with a 22-gauge needle but only cannulated the carotid artery and the needle was removed and pressure was held.  Subsequently approach the right subclavian area. Local anesthetic consisting of Xylocaine was infiltrated in the right subclavian area. Using ultrasound in a supraclavicular position I could identify the right subclavian vein. Using a 16-gauge needle I cannulated the vein and had withdrawal of blood. I then placed a wire through the needle but it wouldn't thread very well. I removed the needle and changed the angle toward the vein.  The right subclavian vein was recannulated. I subsequently was able to thread the wire through the needle. Fluoroscopy was used to demonstrate the wire being in the superior vena cava. A small incision was made around the wire sharply.  Inferior to this on the right chest wall local anesthetic was infiltrated superficially and deep. A small transverse incision was made in the chest wall. Using electrocautery a pocket was created for the port. I then created a tunnel  between the superior and inferior incision and passed the catheter through this tunnel. A dilator introducer complex was placed over the wire and its position verified in the superior vena cava. The dilator and wire were removed. The catheter was threaded through the peel-away sheath introducer into the right atrial area. The catheter tip was pulled back until it was in the mid to distal superior vena cava. The catheter was subsequently cut and the port was attached. It aspirated blood and flushed easily. The port was then anchored to the chest wall with interrupted 2-0 Vicryl sutures.  The subcutaneous tissue was closed over the port with running 0 Vicryl suture. Both skin incisions were closed with 4-0 Monocryl subcuticular stitches. I then in injected concentrated heparin solution into the port. Steri-Strips and sterile dressings were then applied to the incisions.  He tolerated the procedure well without apparent complications and was taken to the recovery room in satisfactory condition.  Estimated Blood Loss:  less than 100 mL        Complications:  * No complications entered in OR log *         Disposition: PACU - hemodynamically stable.         Condition: stable

## 2012-10-29 ENCOUNTER — Encounter (HOSPITAL_COMMUNITY): Payer: Self-pay | Admitting: General Surgery

## 2012-11-01 ENCOUNTER — Other Ambulatory Visit: Payer: Self-pay

## 2012-11-01 ENCOUNTER — Telehealth: Payer: Self-pay | Admitting: Internal Medicine

## 2012-11-01 ENCOUNTER — Other Ambulatory Visit (HOSPITAL_BASED_OUTPATIENT_CLINIC_OR_DEPARTMENT_OTHER): Payer: Medicare Other | Admitting: Lab

## 2012-11-01 ENCOUNTER — Ambulatory Visit (HOSPITAL_BASED_OUTPATIENT_CLINIC_OR_DEPARTMENT_OTHER): Payer: Medicare Other | Admitting: Internal Medicine

## 2012-11-01 VITALS — BP 134/87 | HR 67 | Temp 97.0°F | Resp 18 | Ht 71.0 in | Wt 144.4 lb

## 2012-11-01 DIAGNOSIS — C8330 Diffuse large B-cell lymphoma, unspecified site: Secondary | ICD-10-CM | POA: Insufficient documentation

## 2012-11-01 DIAGNOSIS — C8590 Non-Hodgkin lymphoma, unspecified, unspecified site: Secondary | ICD-10-CM

## 2012-11-01 DIAGNOSIS — C8589 Other specified types of non-Hodgkin lymphoma, extranodal and solid organ sites: Secondary | ICD-10-CM

## 2012-11-01 LAB — CBC WITH DIFFERENTIAL/PLATELET
Basophils Absolute: 0.1 10*3/uL (ref 0.0–0.1)
EOS%: 4.8 % (ref 0.0–7.0)
HGB: 11.9 g/dL — ABNORMAL LOW (ref 13.0–17.1)
MCH: 29.8 pg (ref 27.2–33.4)
NEUT#: 6.5 10*3/uL (ref 1.5–6.5)
RDW: 15.1 % — ABNORMAL HIGH (ref 11.0–14.6)
lymph#: 2 10*3/uL (ref 0.9–3.3)

## 2012-11-01 LAB — COMPREHENSIVE METABOLIC PANEL (CC13)
ALT: 7 U/L (ref 0–55)
AST: 13 U/L (ref 5–34)
Albumin: 3.1 g/dL — ABNORMAL LOW (ref 3.5–5.0)
BUN: 11.3 mg/dL (ref 7.0–26.0)
Calcium: 9.1 mg/dL (ref 8.4–10.4)
Chloride: 105 mEq/L (ref 98–109)
Potassium: 4.6 mEq/L (ref 3.5–5.1)
Sodium: 137 mEq/L (ref 136–145)
Total Protein: 6.8 g/dL (ref 6.4–8.3)

## 2012-11-01 MED ORDER — PREDNISONE 20 MG PO TABS: ORAL_TABLET | ORAL | Status: AC

## 2012-11-01 MED ORDER — LIDOCAINE-PRILOCAINE 2.5-2.5 % EX CREA: TOPICAL_CREAM | CUTANEOUS | Status: AC

## 2012-11-01 MED ORDER — ALLOPURINOL 300 MG PO TABS: 300.0000 mg | ORAL_TABLET | Freq: Every day | ORAL | Status: AC

## 2012-11-01 MED ORDER — TRAMADOL HCL 50 MG PO TABS: 50.0000 mg | ORAL_TABLET | Freq: Three times a day (TID) | ORAL | Status: AC | PRN

## 2012-11-01 MED ORDER — PROCHLORPERAZINE MALEATE 10 MG PO TABS: 10.0000 mg | ORAL_TABLET | Freq: Four times a day (QID) | ORAL | Status: AC | PRN

## 2012-11-01 NOTE — Telephone Encounter (Signed)
gv pt dtr appt schedule for September and October.

## 2012-11-02 ENCOUNTER — Ambulatory Visit (HOSPITAL_BASED_OUTPATIENT_CLINIC_OR_DEPARTMENT_OTHER): Payer: Medicare Other

## 2012-11-02 ENCOUNTER — Other Ambulatory Visit: Payer: Self-pay | Admitting: Internal Medicine

## 2012-11-02 ENCOUNTER — Ambulatory Visit: Payer: Medicare Other | Admitting: Nutrition

## 2012-11-02 ENCOUNTER — Other Ambulatory Visit (HOSPITAL_BASED_OUTPATIENT_CLINIC_OR_DEPARTMENT_OTHER): Payer: Medicare Other | Admitting: Lab

## 2012-11-02 VITALS — BP 138/70 | HR 72 | Temp 97.8°F | Resp 18

## 2012-11-02 DIAGNOSIS — C8589 Other specified types of non-Hodgkin lymphoma, extranodal and solid organ sites: Secondary | ICD-10-CM

## 2012-11-02 DIAGNOSIS — C8330 Diffuse large B-cell lymphoma, unspecified site: Secondary | ICD-10-CM

## 2012-11-02 DIAGNOSIS — Z5111 Encounter for antineoplastic chemotherapy: Secondary | ICD-10-CM

## 2012-11-02 LAB — BASIC METABOLIC PANEL (CC13)
BUN: 13.1 mg/dL (ref 7.0–26.0)
CO2: 23 mEq/L (ref 22–29)
Calcium: 9.3 mg/dL (ref 8.4–10.4)
Creatinine: 1.1 mg/dL (ref 0.7–1.3)
Glucose: 134 mg/dl (ref 70–140)

## 2012-11-02 MED ORDER — SODIUM CHLORIDE 0.9 % IV SOLN
50.0000 mg/m2 | Freq: Once | INTRAVENOUS | Status: AC
  Administered 2012-11-02: 90 mg via INTRAVENOUS
  Filled 2012-11-02: qty 4.5

## 2012-11-02 MED ORDER — ACETAMINOPHEN 325 MG PO TABS
650.0000 mg | ORAL_TABLET | Freq: Four times a day (QID) | ORAL | Status: DC | PRN
  Administered 2012-11-02: 650 mg via ORAL

## 2012-11-02 MED ORDER — DEXAMETHASONE SODIUM PHOSPHATE 20 MG/5ML IJ SOLN
INTRAMUSCULAR | Status: AC
  Filled 2012-11-02: qty 5

## 2012-11-02 MED ORDER — SODIUM CHLORIDE 0.9 % IJ SOLN
10.0000 mL | INTRAMUSCULAR | Status: DC | PRN
  Administered 2012-11-02: 10 mL
  Filled 2012-11-02: qty 10

## 2012-11-02 MED ORDER — ONDANSETRON 16 MG/50ML IVPB (CHCC)
INTRAVENOUS | Status: AC
  Filled 2012-11-02: qty 16

## 2012-11-02 MED ORDER — ONDANSETRON 16 MG/50ML IVPB (CHCC)
16.0000 mg | Freq: Once | INTRAVENOUS | Status: AC
  Administered 2012-11-02: 16 mg via INTRAVENOUS

## 2012-11-02 MED ORDER — DIPHENHYDRAMINE HCL 50 MG/ML IJ SOLN: 50.0000 mg | Freq: Once | INTRAMUSCULAR | Status: DC

## 2012-11-02 MED ORDER — ACETAMINOPHEN 325 MG PO TABS
ORAL_TABLET | ORAL | Status: AC
  Filled 2012-11-02: qty 2

## 2012-11-02 MED ORDER — DIPHENHYDRAMINE HCL 25 MG PO CAPS
50.0000 mg | ORAL_CAPSULE | Freq: Once | ORAL | Status: AC
  Administered 2012-11-02: 50 mg via ORAL

## 2012-11-02 MED ORDER — DIPHENHYDRAMINE HCL 25 MG PO CAPS
ORAL_CAPSULE | ORAL | Status: AC
  Filled 2012-11-02: qty 2

## 2012-11-02 MED ORDER — SODIUM CHLORIDE 0.9 % IV SOLN
750.0000 mg/m2 | Freq: Once | INTRAVENOUS | Status: AC
  Administered 2012-11-02: 1360 mg via INTRAVENOUS
  Filled 2012-11-02: qty 68

## 2012-11-02 MED ORDER — SODIUM CHLORIDE 0.9 % IV SOLN
Freq: Once | INTRAVENOUS | Status: AC
  Administered 2012-11-02: 09:00:00 via INTRAVENOUS

## 2012-11-02 MED ORDER — DEXAMETHASONE SODIUM PHOSPHATE 20 MG/5ML IJ SOLN
20.0000 mg | Freq: Once | INTRAMUSCULAR | Status: AC
  Administered 2012-11-02: 20 mg via INTRAVENOUS

## 2012-11-02 MED ORDER — SODIUM CHLORIDE 0.9 % IV SOLN
2.0000 mg | Freq: Once | INTRAVENOUS | Status: AC
  Administered 2012-11-02: 2 mg via INTRAVENOUS
  Filled 2012-11-02: qty 2

## 2012-11-02 MED ORDER — HEPARIN SOD (PORK) LOCK FLUSH 100 UNIT/ML IV SOLN
500.0000 [IU] | Freq: Once | INTRAVENOUS | Status: AC | PRN
  Administered 2012-11-02: 500 [IU]
  Filled 2012-11-02: qty 5

## 2012-11-02 MED ORDER — SODIUM CHLORIDE 0.9 % IV SOLN
375.0000 mg/m2 | Freq: Once | INTRAVENOUS | Status: AC
  Administered 2012-11-02: 700 mg via INTRAVENOUS
  Filled 2012-11-02: qty 70

## 2012-11-02 NOTE — Progress Notes (Signed)
Patient is an 79 year old male diagnosed lymphoma.  Past medical history includes hypertension, GERD, MI, COPD, TIA, esophagitis, hyperlipidemia, hypothyroidism, anxiety, and hard of hearing.  Medications include Ativan, Protonix, and Zocor.  Labs include sodium of 134 and glucose 69 on September 11.  Height: 5 feet 11 inches. Weight: 144.4 pounds. Usual body weight: 142 pounds 09/01/2012. BMI: 20.15.  I spoke with patient, daughter, and daughter-in-law today in the chemotherapy room.  Patient reports that he has been drinking a lot of fluids.  His appetite is good.  He does complain of increased fatigue.  He has tried and enjoyed [employer] and ensure.  Nutrition diagnosis: Food and nutrition related knowledge deficit related to new diagnosis of lymphoma and associated treatments as evidenced by no prior need for nutrition related information.  Intervention: Patient was educated to consume smaller, more frequent meals and snacks throughout the day with protein at every meal and snack.  I have educated him on protein sources in his diet.  I have encouraged him to consume Carnation breakfast essentials or ensure at least once daily.  I provided recipes for him.  I've also given him fact sheets on increasing calories and protein.  Contact information provided, questions were answered and teach back method used.  Monitoring, evaluation, goals: Patient will tolerate adequate calories and protein to promote weight maintenance throughout treatment and minimize nutrition impact symptoms.  Next visit: Tuesday, October 7, during chemotherapy.

## 2012-11-02 NOTE — Progress Notes (Signed)
ID: Brandon Cisneros OB: [birth year]  MR#: [id number]  CSN#:[id number]  [facility]  [phone] [phone]   OFFICE PROGRESS NOTE  PCP: Corky Downs, MD  DIAGNOSIS: DLBCL   Current Treatment: R-CEOP q 21 daysfor patients with a contraindication to anthracyclines (rituximab + cyclophosphamide + etoposide + vincristine + prednisone).  Day1: Cyclophosphamide 750 mg/m^2 IV + etoposide 50 mg/m^2 IV + vincristine 1.4 mg/m^2 IV (max dose 2 mg).  Day 1: Rituximab 375 mg/m^2 IV.  Days 1-5: Prednisone 100 mg orally.  Days 2 and 3: Etoposide 50 mg /m^2 IV for 6 cycles.   Cycle #1 of planned #6  starts on 11/02/2012.  References: NCCN clinical practice guidelines and Moccia AA et al. January 24 2008; [place], [place]. Blood. 2009: 114: Abstract 408.   HISTORY OF PRESENT ILLNESS: The patient is a 79 y.o.man who initially developed diffuse pain for 4 days 6 weeks ago.He was seen by PCP who arranged abdominal U/S and chest x-ray. Abdominal ultrasound showed a density in pancreatic head. The patient had MRI scan which was positive for greater then 3 cm mass in pancreatic head. He had upper EUS with FNA of pancreatic mass. Pathology reported findings consistent with high grade b cell lymphoproliferative process.  FNA material consists of lymphoid appearing tissue characterized by relative abundance of large lymphoid cells associated with necrosis/apoptosis. Material for flow cytometric analysis is not available for analysis. Immunohistochemical stains were performed on a somewhat limited cell block material. The stains include LCA, CD20, CD79a, CD3, CD43, BCL-2, CD10 and BCL-6 with appropriate controls. The large lymphoid appearing cells are positive for LCA, CD79a, BCL-2, BCL-6 and CD20 (partial). This is admixed with a minor population of T cells as seen with CD3 and CD43. 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 Large cell type is favored. The patient and family  was seen first time in clinic on  09/15/2012.  We discussed NCCN guidelines recommendations for initial work up for presumable DLBCL.  We decided to proceed with PET/CT then excisional LN biopsy. bone marrow biopsy wi CBC, uric acid, LDH, Comprehensive metabolic panel, hepatitis B testing.    INTERVAL HISTORY: The patient presented today for follow up visit. He was seen on 10/14/2012 by Dr.  Marinus Maw.  He had his port placed 4 days ago and complains of some minor discomfort.  His appetite continues to improve.  The patient denied fever, chills, night sweats. He denied headaches, double vision, blurry vision, nasal discharge, hearing problems, odynophagia or dysphagia. No chest pain, palpitations, cough, abdominal pain, nausea, vomiting, diarrhea, constipation, hematochezia. The patient denied dysuria, nocturia, polyuria, hematuria, myalgia, numbness, tingling, psychiatric problems  .Review of Systems  Constitutional: Positive for malaise/fatigue. Negative for fever, chills, weight loss and diaphoresis.  HENT: Positive for congestion. Negative for hearing loss, nosebleeds, sore throat, neck pain and tinnitus.   Eyes: Negative for blurred vision, double vision, photophobia and pain.  Respiratory: Positive for shortness of breath. Negative for cough, hemoptysis, sputum production, wheezing and stridor.   Cardiovascular: Negative for chest pain, palpitations, orthopnea, claudication, leg swelling and PND.  Gastrointestinal: Negative for heartburn, nausea, vomiting, abdominal pain, diarrhea, constipation, blood in stool and melena.  Genitourinary: Negative for dysuria, urgency, frequency, hematuria and flank pain.  Musculoskeletal: Negative for myalgias, back pain, joint pain and falls.  Skin: Negative for itching and rash.  Neurological: Negative for dizziness, tingling, tremors, sensory change, speech change, focal weakness, seizures, loss of consciousness, weakness and headaches.  Endo/Heme/Allergies:  Does not bruise/bleed easily.  Psychiatric/Behavioral: Negative.     PAST MEDICAL HISTORY: Past Medical History  Diagnosis Date  . Hypertension   . GERD (gastroesophageal reflux disease)   . Cancer 2011    prostate  . Dysrhythmia     afib  . Myocardial infarction 2000  . COPD (chronic obstructive pulmonary disease)   . Bronchitis 2 months ago  . TIA (transient ischemic attack) 5 years ago    questionable  . Sleep apnea     no cpap used  . Reflux esophagitis   . Hyperlipidemia   . Hypothyroidism     no current thryoid meds  . Anxiety     occasional  . Shortness of breath   . Complication of anesthesia     went into ?A-fib  . Family history of anesthesia complication     daughter has n/v after anesthesia  . Stroke     TIA  . Chronic kidney disease     kidney stones  . H/O hiatal hernia   . Shingles     PAST SURGICAL HISTORY: Past Surgical History  Procedure Laterality Date  . Radioactive seed implants  2011  . Hernia repair  2012  . Coronary artery bypass graft  2000    x 1  . Back surgery  1997    upper back  . Eus N/A 09/01/2012    Procedure: ESOPHAGEAL ENDOSCOPIC ULTRASOUND (EUS) RADIAL;  Surgeon: Willis Modena, MD;  Location: [facility];  Service: Endoscopy;  Laterality: N/A;  . Fine needle aspiration N/A 09/01/2012    Procedure: FINE NEEDLE ASPIRATION (FNA) LINEAR;  Surgeon: Willis Modena, MD;  Location: [facility];  Service: Endoscopy;  Laterality: N/A;  +or- fna  . Axillary lymph node biopsy Right 10/12/2012    Procedure: EXCISION RIGHT  AXILLARY LYMPH NODES;  Surgeon: Adolph Pollack, MD;  Location: [facility] ORS;  Service: General;  Laterality: Right;  . Cardiac catheterization    . Eye surgery Bilateral 20 yrs    cataracts  . Portacath placement N/A 10/28/2012    Procedure: INSERTION PORT-A-CATH;  Surgeon: Adolph Pollack, MD;  Location: [facility] OR;  Service: General;  Laterality: N/A;    FAMILY HISTORY Family History  Problem Relation Age of Onset  .  Heart disease Mother   . Cancer Father     prostate ca  . Alzheimer's disease Sister   . Cancer Sister     lung  . Cancer Sister     liver ca    HEALTH MAINTENANCE: History  Substance Use Topics  . Smoking status: Current Every Day Smoker -- 0.25 packs/day for 75 years    Types: Cigarettes  . Smokeless tobacco: Never Used     Comment: Light smoker  . Alcohol Use: No    Allergies  Allergen Reactions  . Hydrocodone Itching    Current Outpatient Prescriptions  Medication Sig Dispense Refill  . amiodarone (PACERONE) 200 MG tablet Take 100 mg by mouth every morning.      Marland Kitchen aspirin 81 MG tablet Take 81 mg by mouth daily.      . clopidogrel (PLAVIX) 75 MG tablet Take 75 mg by mouth daily.      . enalapril (VASOTEC) 5 MG tablet Take 2.5 mg by mouth every morning.      . Ibuprofen-Diphenhydramine Cit (IBUPROFEN PM PO) Take 1 tablet by mouth at bedtime as needed (for sleep).      . LORazepam (ATIVAN) 0.5 MG tablet Take 0.5 mg by mouth  every 8 (eight) hours as needed for anxiety.       . pantoprazole (PROTONIX) 40 MG tablet Take 40 mg by mouth at bedtime.      . simvastatin (ZOCOR) 20 MG tablet Take 20 mg by mouth every evening.      . tamsulosin (FLOMAX) 0.4 MG CAPS Take 0.4 mg by mouth daily.      Marland Kitchen allopurinol (ZYLOPRIM) 300 MG tablet Take 1 tablet (300 mg total) by mouth daily.  30 tablet  3  . lidocaine-prilocaine (EMLA) cream Apply to port-a-cath 1-2 hours before needle access for numbing.  30 g  1  . predniSONE (DELTASONE) 20 MG tablet Take 5 tabs (100mg ) daily on days 1-5 of chemotherapy  25 tablet  3  . prochlorperazine (COMPAZINE) 10 MG tablet Take 1 tablet (10 mg total) by mouth every 6 (six) hours as needed.  30 tablet  6  . traMADol (ULTRAM) 50 MG tablet Take 1 tablet (50 mg total) by mouth every 8 (eight) hours as needed for pain.  60 tablet  0   No current facility-administered medications for this visit.    OBJECTIVE: Filed Vitals:   11/01/12 0839  BP: 134/87   Pulse: 67  Temp: 97 F (36.1 C)  Resp: 18     Body mass index is 20.15 kg/(m^2).    ECOG FS: 2  PHYSICAL EXAMINATION:  HEENT: Sclerae anicteric.  Conjunctivae were pink. Pupils round and reactive bilaterally. Oral mucosa is moist without ulceration or thrush. No occipital, submandibular, cervical, supraclavicular or axillar adenopathy. Lungs: clear to auscultation without wheezes. No rales or rhonchi. Chest: R side port without evidence of infection minor TTP.  Heart: regular rate and rhythm. No murmur, gallop or rubs. Abdomen: soft, non tender. No guarding or rebound tenderness. Bowel sounds are present. No palpable hepatosplenomegaly. MSK: no focal spinal tenderness. Extremities: No clubbing or cyanosis.No calf tenderness to palpitation, no peripheral edema.  Skin exam was without ecchymosis, petechiae. Neuro: non-focal, alert and oriented to time, person and place, appropriate affect  LAB RESULTS:  CMP     Component Value Date/Time   NA 137 11/01/2012 0814   NA 134* 10/28/2012 1513   K 4.6 11/01/2012 0814   K 4.5 10/28/2012 1513   CL 100 10/28/2012 1513   CO2 25 11/01/2012 0814   CO2 22 10/28/2012 1513   GLUCOSE 82 11/01/2012 0814   GLUCOSE 69* 10/28/2012 1513   BUN 11.3 11/01/2012 0814   BUN 11 10/28/2012 1513   CREATININE 1.2 11/01/2012 0814   CREATININE 1.26 10/28/2012 1513   CALCIUM 9.1 11/01/2012 0814   CALCIUM 9.0 10/28/2012 1513   PROT 6.8 11/01/2012 0814   ALBUMIN 3.1* 11/01/2012 0814   AST 13 11/01/2012 0814   ALT 7 11/01/2012 0814   ALKPHOS 57 11/01/2012 0814   BILITOT 0.57 11/01/2012 0814   GFRNONAA 51* 10/28/2012 1513   GFRAA 59* 10/28/2012 1513    Lab Results  Component Value Date   WBC 10.2 11/01/2012   NEUTROABS 6.5 11/01/2012   HGB 11.9* 11/01/2012   HCT 35.1* 11/01/2012   MCV 88.3 11/01/2012   PLT 303 11/01/2012      Chemistry      Component Value Date/Time   NA 137 11/01/2012 0814   NA 134* 10/28/2012 1513   K 4.6 11/01/2012 0814   K 4.5 10/28/2012 1513   CL 100  10/28/2012 1513   CO2 25 11/01/2012 0814   CO2 22 10/28/2012 1513   BUN 11.3 11/01/2012  0814   BUN 11 10/28/2012 1513   CREATININE 1.2 11/01/2012 0814   CREATININE 1.26 10/28/2012 1513      Component Value Date/Time   CALCIUM 9.1 11/01/2012 0814   CALCIUM 9.0 10/28/2012 1513   ALKPHOS 57 11/01/2012 0814   AST 13 11/01/2012 0814   ALT 7 11/01/2012 0814   BILITOT 0.57 11/01/2012 0814      STUDIES: Ct Biopsy  09/30/2012   *RADIOLOGY REPORT*  Clinical history: History of B-cell lymphoproliferative disorder.   PROCEDURE(S): CT GUIDED BONE MARROW ASPIRATES AND BIOPSY  Physician: Rachelle Hora. Henn, MD   Medications: Versed 1 mg, Fentanyl 100 mcg. A radiology nurse monitored the patient for moderate sedation.  Sedation time:  10 minutes   Procedure: The procedure was explained to the patient. The risks and benefits of the procedure were discussed and the patient's questions were addressed.  Informed consent was obtained from the patient. The patient was placed prone on CT scan. Images of the pelvis were obtained. The right side of back was prepped and draped in sterile fashion. The skin and right posterior iliac bone were anesthetized with 1% lidocaine.   11 gauge bone needle was directed into the right iliac bone with CT guidance. Two aspirates and one core biopsy obtained.   Findings: Needle position confirmed within the posterior right iliac bone.  Complications: None   Impression: CT guided bone marrow aspirates and core biopsy.   Original Report Authenticated By: Richarda Overlie, M.D.    1. 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 Grade (if applicable): High grade (grade 3/3) Flow cytometry: Non-contributory ([id number]) Immunohistochemical stains: CD20, CD79a, CD3, CD43, CD10, BCL-6 and BCL-2  performed on block 1B with appropriate controls. Touch preps/imprints: Abundance of large lymphoid cells admixed with small lymphoid cells. Comments: The sections primarily show soft fatty tissue densely and diffusely infiltrated by a relatively monomorphic population of large lymphoid cells with round to oval to multilobated nuclei, vesicular chromatin and prominent nucleoli. This is associated with brisk mitoses and apoptosis. Relatively intact lymph nodal tissue is seen adjacent to fatty tissue displaying variable number of atypical lymphoid follicles mostly characterized by a homogenous composition of predominately small angulated lymphoid cells. Occasional atypical lymphoid follicles have predominance of large lymphoid cells. To further evaluate this process, flow cytometric analysis was attempted but failed to show any monoclonal B cell population or abnormal T cell phenotype possibly related to sampling. Hence, immunohistochemical stains were performed and show that the large atypical lymphoid cells in the diffuse areas stain positively for B cell markers CD20 and CD79a in addition to positivity for BCL-6, BCL-2 and patchy positivity for CD10. The large lymphoid cells show an admixture with a minor component of T cells as seen with CD3 and CD43. The atypical follicular structures within the intact lymphoid tissue show positivity for B cell markers CD79a and CD20 in addition to positivity for CD10, BCL-2 and BCL-6. Given the overall histologic and immunophenotypic features, the finding are consistent with diffuse large B cell lymphoma arising in a background of follicular B cell lymphoma. The latter process consist of both low grade and focally high grade components)..    ASSESSMENT AND PLAN: 1. DLBCL in the background of follicular lymphoma. -- We reviewed the indications for treatment, pathology and imaging.  He choose to proceed with R-CEOP  q 21 days tomorrow. We then discussed the  benefits and risks of chemotherapy including the risks of myelosuppression resulting in infections that can be life-threatening.  Other side effects including cystitis hemorrhagic, anaphylaxis, peripheral neuropathy, nausea and vomiting, constipation, etc.  His regiment is as follows:  RCEOP for patients with a contraindcation to anthracyclines (rituximab + cyclophosphamide + etoposide + vincristine + prednisone).    Day1: Cyclophosphamide 750 mg/m^2 IV + etoposide 50 mg/m^2 IV + vincristine 1.4 mg/m^2 IV (max dose 2 mg).    Day 1: Rituximab 375 mg/m^2 IV.    Days 1-5: Prednisone 100 mg orally.    Days 2 and 3: Etoposide 50 mg /m^2 IV for 6 cycles.   --He has been provided detail handouts and chemotherapy teaching.  He is aware to call the on-call MD if his temperature exceeds 100.5 as he may have to report to the nearest ER.  --We will check a CBC on 09/23.  He as instructed on neutropenia precautions including avoiding large crowds, sick contacts.   2. Follow-up.  RTC in 3 weeks for consideration of Cycle #2. Will check LDH, CMP, CBC.   Chike Farrington, MD  11/01/2012  5:00 pm

## 2012-11-02 NOTE — Patient Instructions (Signed)
[facility] Discharge Instructions for Patients Receiving Chemotherapy  Today you received the following chemotherapy agents Vincristine/Cytoxan/VP 16 (Etoposide)/ Rituxan To help prevent nausea and vomiting after your treatment, we encourage you to take your nausea medication as prescribed. If you develop nausea and vomiting that is not controlled by your nausea medication, call the clinic.   BELOW ARE SYMPTOMS THAT SHOULD BE REPORTED IMMEDIATELY:  *FEVER GREATER THAN 100.5 F  *CHILLS WITH OR WITHOUT FEVER  NAUSEA AND VOMITING THAT IS NOT CONTROLLED WITH YOUR NAUSEA MEDICATION  *UNUSUAL SHORTNESS OF BREATH  *UNUSUAL BRUISING OR BLEEDING  TENDERNESS IN MOUTH AND THROAT WITH OR WITHOUT PRESENCE OF ULCERS  *URINARY PROBLEMS  *BOWEL PROBLEMS  UNUSUAL RASH Items with * indicate a potential emergency and should be followed up as soon as possible.  Feel free to call the clinic you have any questions or concerns. The clinic phone number is [phone].     Vincristine injection What is this medicine? VINCRISTINE (vin KRIS teen) is a chemotherapy drug. It slows the growth of cancer cells. This medicine is used to treat many types of cancer like Hodgkin's disease, leukemia, non-Hodgkin's lymphoma, neuroblastoma (brain cancer), rhabdomyosarcoma, and Wilms' tumor. This medicine may be used for other purposes; ask your health care provider or pharmacist if you have questions. What should I tell my health care provider before I take this medicine? They need to know if you have any of these conditions: -blood disorders -gout -infection (especially chickenpox, cold sores, or herpes) -kidney disease -liver disease -lung disease -nervous system disease like Charcot-Marie-Tooth (CMT) -recent or ongoing radiation therapy -an unusual or allergic reaction to vincristine, other chemotherapy agents, other medicines, foods, dyes, or preservatives -pregnant or trying to get  pregnant -breast-feeding How should I use this medicine? This drug is given as an infusion into a vein. It is administered in a hospital or clinic by a specially trained health care professional. If you have pain, swelling, burning, or any unusual feeling around the site of your injection, tell your health care professional right away. Talk to your pediatrician regarding the use of this medicine in children. While this drug may be prescribed for selected conditions, precautions do apply. Overdosage: If you think you have taken too much of this medicine contact a poison control center or emergency room at once. NOTE: This medicine is only for you. Do not share this medicine with others. What if I miss a dose? It is important not to miss your dose. Call your doctor or health care professional if you are unable to keep an appointment. What may interact with this medicine? Do not take this medicine with any of the following medications: -itraconazole -mibefradil -voriconazole This medicine may also interact with the following medications: -cyclosporine -erythromycin -fluconazole -ketoconazole -medicines for HIV like delavirdine, efavirenz, nevirapine -medicines for seizures like ethotoin, fosphenotoin, phenytoin -medicines to increase blood counts like filgrastim, pegfilgrastim, sargramostim -other chemotherapy drugs like cisplatin, L-asparaginase, methotrexate, mitomycin, paclitaxel -pegaspargase -vaccines -zalcitabine, ddC Talk to your doctor or health care professional before taking any of these medicines: -acetaminophen -aspirin -ibuprofen -ketoprofen -naproxen This list may not describe all possible interactions. Give your health care provider a list of all the medicines, herbs, non-prescription drugs, or dietary supplements you use. Also tell them if you smoke, drink alcohol, or use illegal drugs. Some items may interact with your medicine. What should I watch for while using this  medicine? Your condition will be monitored carefully while you are receiving this medicine.  You will need important blood work done while you are taking this medicine. This drug may make you feel generally unwell. This is not uncommon, as chemotherapy can affect healthy cells as well as cancer cells. Report any side effects. Continue your course of treatment even though you feel ill unless your doctor tells you to stop. In some cases, you may be given additional medicines to help with side effects. Follow all directions for their use. Call your doctor or health care professional for advice if you get a fever, chills or sore throat, or other symptoms of a cold or flu. Do not treat yourself. Avoid taking products that contain aspirin, acetaminophen, ibuprofen, naproxen, or ketoprofen unless instructed by your doctor. These medicines may hide a fever. Do not become pregnant while taking this medicine. Women should inform their doctor if they wish to become pregnant or think they might be pregnant. There is a potential for serious side effects to an unborn child. Talk to your health care professional or pharmacist for more information. Do not breast-feed an infant while taking this medicine. Men may have a lower sperm count while taking this medicine. Talk to your doctor if you plan to father a child. What side effects may I notice from receiving this medicine? Side effects that you should report to your doctor or health care professional as soon as possible: -allergic reactions like skin rash, itching or hives, swelling of the face, lips, or tongue -breathing problems -confusion or changes in emotions or moods -constipation -cough -mouth sores -muscle weakness -nausea and vomiting -pain, swelling, redness or irritation at the injection site -pain, tingling, numbness in the hands or feet -problems with balance, talking, walking -seizures -stomach pain -trouble passing urine or change in the amount  of urine Side effects that usually do not require medical attention (report to your doctor or health care professional if they continue or are bothersome): -diarrhea -hair loss -jaw pain -loss of appetite This list may not describe all possible side effects. Call your doctor for medical advice about side effects. You may report side effects to FDA at 1-800-FDA-1088. Where should I keep my medicine? This drug is given in a hospital or clinic and will not be stored at home. NOTE: This sheet is a summary. It may not cover all possible information. If you have questions about this medicine, talk to your doctor, pharmacist, or health care provider.  2013, [employer]. (11/01/2007 5:17:13 PM)    Cyclophosphamide injection (Cytoxan) What is this medicine? CYCLOPHOSPHAMIDE (sye kloe FOSS fa mide) is a chemotherapy drug. It slows the growth of cancer cells. This medicine is used to treat many types of cancer like lymphoma, myeloma, leukemia, breast cancer, and ovarian cancer, to name a few. It is also used to treat nephrotic syndrome in children. This medicine may be used for other purposes; ask your health care provider or pharmacist if you have questions. What should I tell my health care provider before I take this medicine? They need to know if you have any of these conditions: -blood disorders -history of other chemotherapy -history of radiation therapy -infection -kidney disease -liver disease -tumors in the bone marrow -an unusual or allergic reaction to cyclophosphamide, other chemotherapy, other medicines, foods, dyes, or preservatives -pregnant or trying to get pregnant -breast-feeding How should I use this medicine? This drug is usually given as an injection into a vein or muscle or by infusion into a vein. It is administered in a hospital or clinic by a  specially trained health care professional. Talk to your pediatrician regarding the use of this medicine in children.  While this drug may be prescribed for selected conditions, precautions do apply. Overdosage: If you think you have taken too much of this medicine contact a poison control center or emergency room at once. NOTE: This medicine is only for you. Do not share this medicine with others. What if I miss a dose? It is important not to miss your dose. Call your doctor or health care professional if you are unable to keep an appointment. What may interact with this medicine? Do not take this medicine with any of the following medications: -mibefradil -nalidixic acid This medicine may also interact with the following medications: -doxorubicin -etanercept -medicines to increase blood counts like filgrastim, pegfilgrastim, sargramostim -medicines that block muscle or nerve pain -St. John's Wort -phenobarbital -succinylcholine chloride -trastuzumab -vaccines Talk to your doctor or health care professional before taking any of these medicines: -acetaminophen -aspirin -ibuprofen -ketoprofen -naproxen This list may not describe all possible interactions. Give your health care provider a list of all the medicines, herbs, non-prescription drugs, or dietary supplements you use. Also tell them if you smoke, drink alcohol, or use illegal drugs. Some items may interact with your medicine. What should I watch for while using this medicine? Visit your doctor for checks on your progress. This drug may make you feel generally unwell. This is not uncommon, as chemotherapy can affect healthy cells as well as cancer cells. Report any side effects. Continue your course of treatment even though you feel ill unless your doctor tells you to stop. Drink water or other fluids as directed. Urinate often, even at night. In some cases, you may be given additional medicines to help with side effects. Follow all directions for their use. Call your doctor or health care professional for advice if you get a fever, chills or sore  throat, or other symptoms of a cold or flu. Do not treat yourself. This drug decreases your body's ability to fight infections. Try to avoid being around people who are sick. This medicine may increase your risk to bruise or bleed. Call your doctor or health care professional if you notice any unusual bleeding. Be careful brushing and flossing your teeth or using a toothpick because you may get an infection or bleed more easily. If you have any dental work done, tell your dentist you are receiving this medicine. Avoid taking products that contain aspirin, acetaminophen, ibuprofen, naproxen, or ketoprofen unless instructed by your doctor. These medicines may hide a fever. Do not become pregnant while taking this medicine. Women should inform their doctor if they wish to become pregnant or think they might be pregnant. There is a potential for serious side effects to an unborn child. Talk to your health care professional or pharmacist for more information. Do not breast-feed an infant while taking this medicine. Men should inform their doctor if they wish to father a child. This medicine may lower sperm counts. If you are going to have surgery, tell your doctor or health care professional that you have taken this medicine. What side effects may I notice from receiving this medicine? Side effects that you should report to your doctor or health care professional as soon as possible: -allergic reactions like skin rash, itching or hives, swelling of the face, lips, or tongue -low blood counts - this medicine may decrease the number of white blood cells, red blood cells and platelets. You may be at increased risk  for infections and bleeding. -signs of infection - fever or chills, cough, sore throat, pain or difficulty passing urine -signs of decreased platelets or bleeding - bruising, pinpoint red spots on the skin, black, tarry stools, blood in the urine -signs of decreased red blood cells - unusually weak or  tired, fainting spells, lightheadedness -breathing problems -dark urine -mouth sores -pain, swelling, redness at site where injected -swelling of the ankles, feet, hands -trouble passing urine or change in the amount of urine -weight gain -yellowing of the eyes or skin Side effects that usually do not require medical attention (report to your doctor or health care professional if they continue or are bothersome): -changes in nail or skin color -diarrhea -hair loss -loss of appetite -missed menstrual periods -nausea, vomiting -stomach pain This list may not describe all possible side effects. Call your doctor for medical advice about side effects. You may report side effects to FDA at 1-800-FDA-1088. Where should I keep my medicine? This drug is given in a hospital or clinic and will not be stored at home. NOTE: This sheet is a summary. It may not cover all possible information. If you have questions about this medicine, talk to your doctor, pharmacist, or health care provider.  2013, [employer]. (05/11/2007 2:32:25 PM)   Etoposide, VP-16 injection What is this medicine? ETOPOSIDE, VP-16 (e toe POE side) is a chemotherapy drug. It is used to treat testicular cancer, lung cancer, and other cancers. This medicine may be used for other purposes; ask your health care provider or pharmacist if you have questions. What should I tell my health care provider before I take this medicine? They need to know if you have any of these conditions: -infection -kidney disease -low blood counts, like low white cell, platelet, or red cell counts -an unusual or allergic reaction to etoposide, other chemotherapeutic agents, other medicines, foods, dyes, or preservatives -pregnant or trying to get pregnant -breast-feeding How should I use this medicine? This medicine is for infusion into a vein. It is administered in a hospital or clinic by a specially trained health care  professional. Talk to your pediatrician regarding the use of this medicine in children. Special care may be needed. Overdosage: If you think you have taken too much of this medicine contact a poison control center or emergency room at once. NOTE: This medicine is only for you. Do not share this medicine with others. What if I miss a dose? It is important not to miss your dose. Call your doctor or health care professional if you are unable to keep an appointment. What may interact with this medicine? -cyclosporine -medicines to increase blood counts like filgrastim, pegfilgrastim, sargramostim -vaccines This list may not describe all possible interactions. Give your health care provider a list of all the medicines, herbs, non-prescription drugs, or dietary supplements you use. Also tell them if you smoke, drink alcohol, or use illegal drugs. Some items may interact with your medicine. What should I watch for while using this medicine? Visit your doctor for checks on your progress. This drug may make you feel generally unwell. This is not uncommon, as chemotherapy can affect healthy cells as well as cancer cells. Report any side effects. Continue your course of treatment even though you feel ill unless your doctor tells you to stop. In some cases, you may be given additional medicines to help with side effects. Follow all directions for their use. Call your doctor or health care professional for advice if you get a  fever, chills or sore throat, or other symptoms of a cold or flu. Do not treat yourself. This drug decreases your body's ability to fight infections. Try to avoid being around people who are sick. This medicine may increase your risk to bruise or bleed. Call your doctor or health care professional if you notice any unusual bleeding. Be careful brushing and flossing your teeth or using a toothpick because you may get an infection or bleed more easily. If you have any dental work done, tell  your dentist you are receiving this medicine. Avoid taking products that contain aspirin, acetaminophen, ibuprofen, naproxen, or ketoprofen unless instructed by your doctor. These medicines may hide a fever. Do not become pregnant while taking this medicine. Women should inform their doctor if they wish to become pregnant or think they might be pregnant. There is a potential for serious side effects to an unborn child. Talk to your health care professional or pharmacist for more information. Do not breast-feed an infant while taking this medicine. What side effects may I notice from receiving this medicine? Side effects that you should report to your doctor or health care professional as soon as possible: -allergic reactions like skin rash, itching or hives, swelling of the face, lips, or tongue -low blood counts - this medicine may decrease the number of white blood cells, red blood cells and platelets. You may be at increased risk for infections and bleeding. -signs of infection - fever or chills, cough, sore throat, pain or difficulty passing urine -signs of decreased platelets or bleeding - bruising, pinpoint red spots on the skin, black, tarry stools, blood in the urine -signs of decreased red blood cells - unusually weak or tired, fainting spells, lightheadedness -breathing problems -changes in vision -mouth or throat sores or ulcers -pain, redness, swelling or irritation at the injection site -pain, tingling, numbness in the hands or feet -redness, blistering, peeling or loosening of the skin, including inside the mouth -seizures -vomiting Side effects that usually do not require medical attention (report to your doctor or health care professional if they continue or are bothersome): -diarrhea -hair loss -loss of appetite -nausea -stomach pain This list may not describe all possible side effects. Call your doctor for medical advice about side effects. You may report side effects to FDA  at 1-800-FDA-1088. Where should I keep my medicine? This drug is given in a hospital or clinic and will not be stored at home. NOTE: This sheet is a summary. It may not cover all possible information. If you have questions about this medicine, talk to your doctor, pharmacist, or health care provider.  2013, [employer]. (06/07/2007 5:24:12 PM)    Rituximab injection (Rituxan) What is this medicine? RITUXIMAB (ri TUX i mab) is a monoclonal antibody. This medicine changes the way the body's immune system works. It is used commonly to treat non-Hodgkin's lymphoma and other conditions. In cancer cells, this drug targets a specific protein within cancer cells and stops the cancer cells from growing. It is also used to treat rhuematoid arthritis (RA). In RA, this medicine slow the inflammatory process and help reduce joint pain and swelling. This medicine is often used with other cancer or arthritis medications. This medicine may be used for other purposes; ask your health care provider or pharmacist if you have questions. What should I tell my health care provider before I take this medicine? They need to know if you have any of these conditions: -blood disorders -heart disease -history of hepatitis B -  infection (especially a virus infection such as chickenpox, cold sores, or herpes) -irregular heartbeat -kidney disease -lung or breathing disease, like asthma -lupus -an unusual or allergic reaction to rituximab, mouse proteins, other medicines, foods, dyes, or preservatives -pregnant or trying to get pregnant -breast-feeding How should I use this medicine? This medicine is for infusion into a vein. It is administered in a hospital or clinic by a specially trained health care professional. A special MedGuide will be given to you by the pharmacist with each prescription and refill. Be sure to read this information carefully each time. Talk to your pediatrician regarding the use of  this medicine in children. This medicine is not approved for use in children. Overdosage: If you think you have taken too much of this medicine contact a poison control center or emergency room at once. NOTE: This medicine is only for you. Do not share this medicine with others. What if I miss a dose? It is important not to miss a dose. Call your doctor or health care professional if you are unable to keep an appointment. What may interact with this medicine? -cisplatin -medicines for blood pressure -some other medicines for arthritis -vaccines This list may not describe all possible interactions. Give your health care provider a list of all the medicines, herbs, non-prescription drugs, or dietary supplements you use. Also tell them if you smoke, drink alcohol, or use illegal drugs. Some items may interact with your medicine. What should I watch for while using this medicine? Report any side effects that you notice during your treatment right away, such as changes in your breathing, fever, chills, dizziness or lightheadedness. These effects are more common with the first dose. Visit your prescriber or health care professional for checks on your progress. You will need to have regular blood work. Report any other side effects. The side effects of this medicine can continue after you finish your treatment. Continue your course of treatment even though you feel ill unless your doctor tells you to stop. Call your doctor or health care professional for advice if you get a fever, chills or sore throat, or other symptoms of a cold or flu. Do not treat yourself. This drug decreases your body's ability to fight infections. Try to avoid being around people who are sick. This medicine may increase your risk to bruise or bleed. Call your doctor or health care professional if you notice any unusual bleeding. Be careful brushing and flossing your teeth or using a toothpick because you may get an infection or bleed  more easily. If you have any dental work done, tell your dentist you are receiving this medicine. Avoid taking products that contain aspirin, acetaminophen, ibuprofen, naproxen, or ketoprofen unless instructed by your doctor. These medicines may hide a fever. Do not become pregnant while taking this medicine. Women should inform their doctor if they wish to become pregnant or think they might be pregnant. There is a potential for serious side effects to an unborn child. Talk to your health care professional or pharmacist for more information. Do not breast-feed an infant while taking this medicine. What side effects may I notice from receiving this medicine? Side effects that you should report to your doctor or health care professional as soon as possible: -allergic reactions like skin rash, itching or hives, swelling of the face, lips, or tongue -low blood counts - this medicine may decrease the number of white blood cells, red blood cells and platelets. You may be at increased risk for  infections and bleeding. -signs of infection - fever or chills, cough, sore throat, pain or difficulty passing urine -signs of decreased platelets or bleeding - bruising, pinpoint red spots on the skin, black, tarry stools, blood in the urine -signs of decreased red blood cells - unusually weak or tired, fainting spells, lightheadedness -breathing problems -confused, not responsive -chest pain -fast, irregular heartbeat -feeling faint or lightheaded, falls -mouth sores -redness, blistering, peeling or loosening of the skin, including inside the mouth -stomach pain -swelling of the ankles, feet, or hands -trouble passing urine or change in the amount of urine Side effects that usually do not require medical attention (report to your doctor or other health care professional if they continue or are bothersome): -anxiety -headache -loss of appetite -muscle aches -nausea -night sweats This list may not describe  all possible side effects. Call your doctor for medical advice about side effects. You may report side effects to FDA at 1-800-FDA-1088. Where should I keep my medicine? This drug is given in a hospital or clinic and will not be stored at home. NOTE: This sheet is a summary. It may not cover all possible information. If you have questions about this medicine, talk to your doctor, pharmacist, or health care provider.  2013, [employer]. (10/04/2007 2:04:59 PM)

## 2012-11-03 ENCOUNTER — Encounter (INDEPENDENT_AMBULATORY_CARE_PROVIDER_SITE_OTHER): Payer: Self-pay | Admitting: General Surgery

## 2012-11-03 ENCOUNTER — Ambulatory Visit (HOSPITAL_BASED_OUTPATIENT_CLINIC_OR_DEPARTMENT_OTHER): Payer: Medicare Other

## 2012-11-03 ENCOUNTER — Ambulatory Visit (INDEPENDENT_AMBULATORY_CARE_PROVIDER_SITE_OTHER): Payer: Medicare Other | Admitting: General Surgery

## 2012-11-03 VITALS — BP 144/68 | HR 74 | Temp 96.8°F | Ht 71.0 in | Wt 153.4 lb

## 2012-11-03 VITALS — BP 148/71 | HR 77 | Temp 97.8°F | Resp 20

## 2012-11-03 DIAGNOSIS — C8590 Non-Hodgkin lymphoma, unspecified, unspecified site: Secondary | ICD-10-CM

## 2012-11-03 DIAGNOSIS — Z5111 Encounter for antineoplastic chemotherapy: Secondary | ICD-10-CM

## 2012-11-03 DIAGNOSIS — Z9889 Other specified postprocedural states: Secondary | ICD-10-CM

## 2012-11-03 DIAGNOSIS — C8330 Diffuse large B-cell lymphoma, unspecified site: Secondary | ICD-10-CM

## 2012-11-03 DIAGNOSIS — C8589 Other specified types of non-Hodgkin lymphoma, extranodal and solid organ sites: Secondary | ICD-10-CM

## 2012-11-03 LAB — HEPATITIS B CORE ANTIBODY, TOTAL: Hep B Core Total Ab: NEGATIVE

## 2012-11-03 LAB — HEPATITIS B SURFACE ANTIBODY,QUALITATIVE: Hep B S Ab: NEGATIVE

## 2012-11-03 LAB — HEPATITIS B SURFACE ANTIGEN: Hepatitis B Surface Ag: NEGATIVE

## 2012-11-03 MED ORDER — SODIUM CHLORIDE 0.9 % IV SOLN
Freq: Once | INTRAVENOUS | Status: AC
  Administered 2012-11-03: 13:00:00 via INTRAVENOUS

## 2012-11-03 MED ORDER — OXYCODONE HCL 5 MG PO TABS
5.0000 mg | ORAL_TABLET | Freq: Once | ORAL | Status: AC
  Administered 2012-11-03: 5 mg via ORAL
  Filled 2012-11-03: qty 1

## 2012-11-03 MED ORDER — SODIUM CHLORIDE 0.9 % IJ SOLN
10.0000 mL | INTRAMUSCULAR | Status: DC | PRN
Start: 2012-11-03 — End: 2012-11-03
  Administered 2012-11-03: 10 mL
  Filled 2012-11-03: qty 10

## 2012-11-03 MED ORDER — DEXAMETHASONE SODIUM PHOSPHATE 10 MG/ML IJ SOLN
INTRAMUSCULAR | Status: AC
  Filled 2012-11-03: qty 1

## 2012-11-03 MED ORDER — SODIUM CHLORIDE 0.9 % IV SOLN
50.0000 mg/m2 | Freq: Once | INTRAVENOUS | Status: AC
  Administered 2012-11-03: 90 mg via INTRAVENOUS
  Filled 2012-11-03: qty 4.5

## 2012-11-03 MED ORDER — ONDANSETRON 8 MG/NS 50 ML IVPB
INTRAVENOUS | Status: AC
  Filled 2012-11-03: qty 8

## 2012-11-03 MED ORDER — ONDANSETRON 8 MG/50ML IVPB (CHCC)
8.0000 mg | Freq: Once | INTRAVENOUS | Status: AC
  Administered 2012-11-03: 8 mg via INTRAVENOUS

## 2012-11-03 MED ORDER — OXYCODONE HCL 5 MG PO TABS: 5.0000 mg | ORAL_TABLET | ORAL | Status: AC | PRN

## 2012-11-03 MED ORDER — DEXAMETHASONE SODIUM PHOSPHATE 10 MG/ML IJ SOLN
10.0000 mg | Freq: Once | INTRAMUSCULAR | Status: AC
  Administered 2012-11-03: 10 mg via INTRAVENOUS

## 2012-11-03 MED ORDER — HEPARIN SOD (PORK) LOCK FLUSH 100 UNIT/ML IV SOLN
500.0000 [IU] | Freq: Once | INTRAVENOUS | Status: AC | PRN
  Administered 2012-11-03: 500 [IU]
  Filled 2012-11-03: qty 5

## 2012-11-03 NOTE — Progress Notes (Signed)
Procedure:  Right axillary lymph node biopsy. Ultrasound-guided Port-A-Cath insertion.  Pathology:  Lymphoma  History:  He is here for a postoperative visit. He used his rider lawnmower and was turning the steering wheel which takes a lot of effort.  After that he got some swelling and bruising around the Port-A-Cath site and the right shoulder area and this has been causing him pain. He said one chemotherapy round and has done okay with this.  Exam: General- Is in NAD. Chest-The incisions at the Port-A-Cath site are clean. There is some peri-incisional ecchymosis radiating to the right shoulder. The right axillary incision is clean.  Assessment:  Postoperative pain and Port-A-Cath site from overexertion due to swelling and bruising.  Plan:  Prescribed OxyIR for pain. Very light activities with right arm until swelling and bruising has resolved. Return visit when necessary.

## 2012-11-03 NOTE — Progress Notes (Signed)
1258 patient c/o 9/10 shoulder pain r/t new port and recent biopsy. Olegario Messier, RN to ask Dr. Rosie Fate for OK to give prn pain medication. This Rn to await further orders. Clayborn Heron, RN

## 2012-11-03 NOTE — Patient Instructions (Signed)
Light activity with right arm until bruising resolved.

## 2012-11-03 NOTE — Patient Instructions (Addendum)
[facility] Discharge Instructions for Patients Receiving Chemotherapy  Today you received the following chemotherapy agents: Etoposide.  To help prevent nausea and vomiting after your treatment, we encourage you to take your nausea medication as prescribed.   If you develop nausea and vomiting that is not controlled by your nausea medication, call the clinic.   BELOW ARE SYMPTOMS THAT SHOULD BE REPORTED IMMEDIATELY:  *FEVER GREATER THAN 100.5 F  *CHILLS WITH OR WITHOUT FEVER  NAUSEA AND VOMITING THAT IS NOT CONTROLLED WITH YOUR NAUSEA MEDICATION  *UNUSUAL SHORTNESS OF BREATH  *UNUSUAL BRUISING OR BLEEDING  TENDERNESS IN MOUTH AND THROAT WITH OR WITHOUT PRESENCE OF ULCERS  *URINARY PROBLEMS  *BOWEL PROBLEMS  UNUSUAL RASH Items with * indicate a potential emergency and should be followed up as soon as possible.  Feel free to call the clinic you have any questions or concerns. The clinic phone number is [phone].    

## 2012-11-04 ENCOUNTER — Ambulatory Visit: Payer: Medicare Other

## 2012-11-04 ENCOUNTER — Telehealth: Payer: Self-pay | Admitting: Internal Medicine

## 2012-11-05 ENCOUNTER — Telehealth: Payer: Self-pay

## 2012-11-05 NOTE — Telephone Encounter (Signed)
Granddaughter called to inform Dr. Rosie Fate that Mr. Lebow was taken to the hospital and had pneumonia in lungs.  He passed away last night.

## 2012-11-08 ENCOUNTER — Other Ambulatory Visit: Payer: Medicare Other | Admitting: Lab

## 2012-11-09 ENCOUNTER — Other Ambulatory Visit: Payer: Medicare Other | Admitting: Lab

## 2012-11-15 ENCOUNTER — Other Ambulatory Visit: Payer: Medicare Other | Admitting: Lab

## 2012-11-17 NOTE — Telephone Encounter (Signed)
lmonvm of the pt's dtr regarding the pt's lab appt on 11/09/2012.

## 2012-11-17 DEATH — deceased

## 2012-11-22 ENCOUNTER — Ambulatory Visit: Payer: Medicare Other

## 2012-11-22 ENCOUNTER — Other Ambulatory Visit: Payer: Medicare Other | Admitting: Lab

## 2012-11-23 ENCOUNTER — Encounter: Payer: Medicare Other | Admitting: Nutrition

## 2012-11-23 ENCOUNTER — Ambulatory Visit: Payer: Medicare Other

## 2012-11-24 ENCOUNTER — Ambulatory Visit: Payer: Medicare Other

## 2012-11-25 ENCOUNTER — Ambulatory Visit: Payer: Medicare Other

## 2012-11-29 ENCOUNTER — Other Ambulatory Visit: Payer: Medicare Other | Admitting: Lab

## 2012-12-06 ENCOUNTER — Other Ambulatory Visit: Payer: Medicare Other

## 2012-12-13 ENCOUNTER — Ambulatory Visit: Payer: Medicare Other

## 2012-12-13 ENCOUNTER — Other Ambulatory Visit: Payer: Medicare Other | Admitting: Lab

## 2012-12-14 ENCOUNTER — Ambulatory Visit: Payer: Medicare Other

## 2012-12-15 ENCOUNTER — Ambulatory Visit: Payer: Medicare Other

## 2012-12-20 ENCOUNTER — Other Ambulatory Visit: Payer: Medicare Other

## 2013-01-05 ENCOUNTER — Other Ambulatory Visit: Payer: Self-pay | Admitting: Internal Medicine

## 2013-01-25 ENCOUNTER — Encounter (HOSPITAL_COMMUNITY): Payer: Self-pay

## 2014-02-28 IMAGING — CT CT BIOPSY
3 series · 6 of 12 positions shown, 9 images · non-contrast
Comparison: none

CLINICAL HISTORY: History of B-cell lymphoproliferative disorder.

[Series 3: add scan 5.0 b65f · axial · 0.64mm/px · z∈[-118,-114]mm · 2 of 4 slices shown, 5 images (1 of 3)]
[im 2/4  soft-tissue]
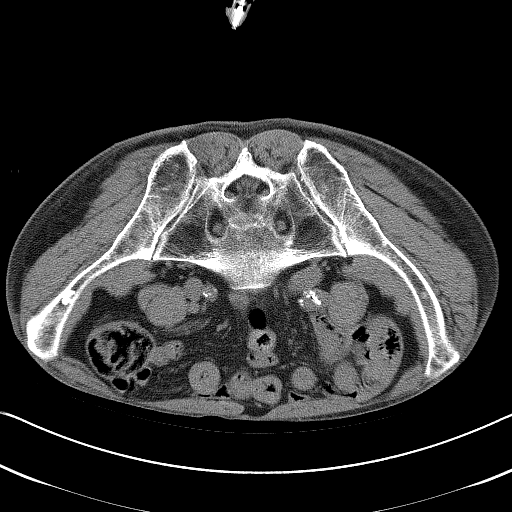
[im 2/4  lung]
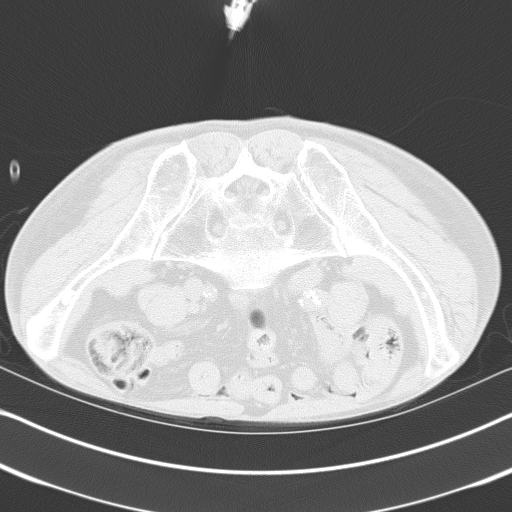
[im 2/4  bone]
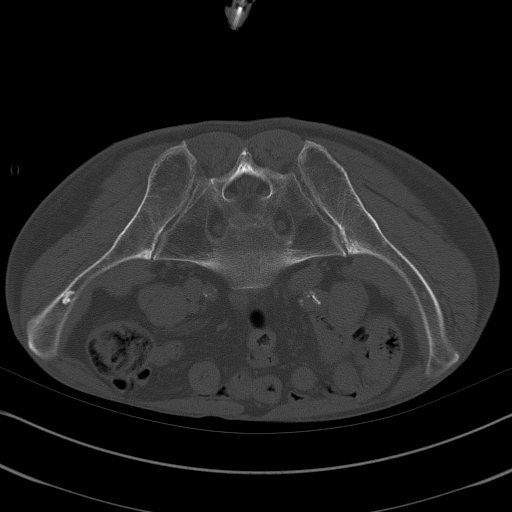
[im 3/4  soft-tissue]
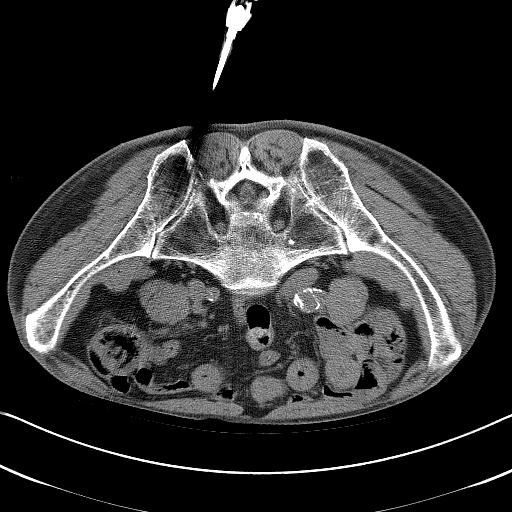
[im 3/4  lung]
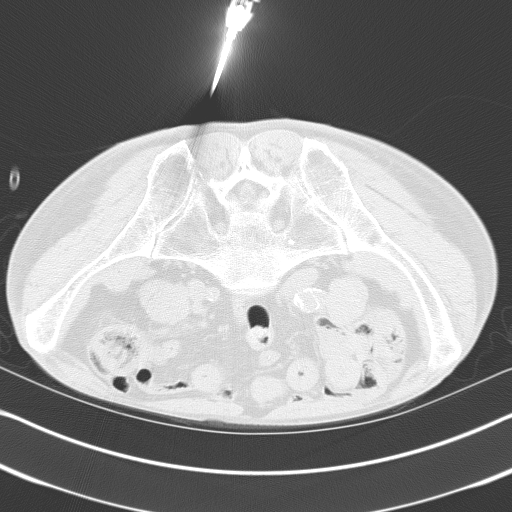

[Series 4: add scan 5.0 b65f · axial · 0.64mm/px · z∈[-114,-108]mm · 2 of 4 slices shown (2 of 3)]
[im 2/4  soft-tissue]
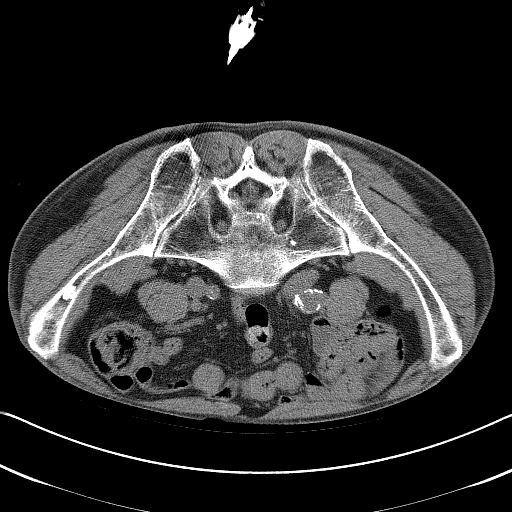
[im 3/4  soft-tissue]
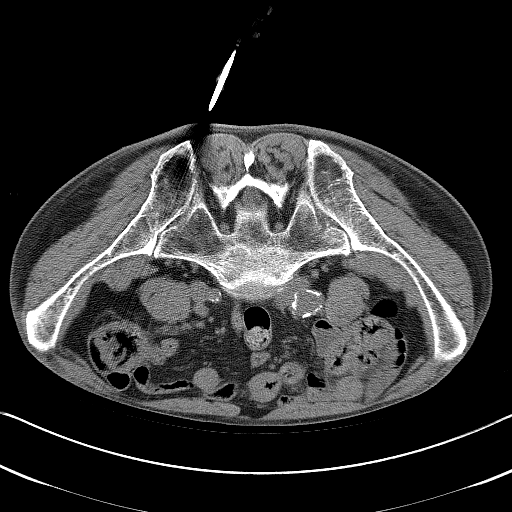

[Series 5: add scan 5.0 b65f · axial · 0.64mm/px · z∈[-104,-98]mm · 2 of 4 slices shown (3 of 3)]
[im 2/4  soft-tissue]
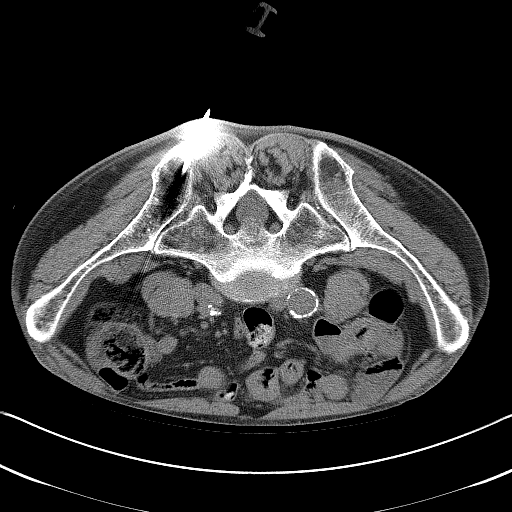
[im 3/4  soft-tissue]
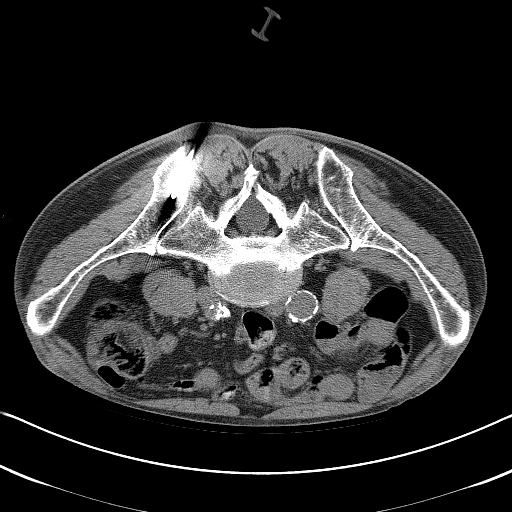

[6 of 12 positions shown; findings below may reference images not displayed]

PROCEDURE(S): CT GUIDED BONE MARROW ASPIRATES AND BIOPSY

 Medications: Versed 1 mg, Fentanyl 100 mcg. A radiology nurse
monitored the patient for moderate sedation.

Sedation time:  10 minutes

 Procedure: The procedure was explained to the patient. The risks
and benefits of the procedure were discussed and the patient's
questions were addressed.  Informed consent was obtained from the
patient. The patient was placed prone on CT scan. Images of the
pelvis were obtained. The right side of back was prepped and draped
in sterile fashion. The skin and right posterior iliac bone were
anesthetized with 1% lidocaine.   11 gauge bone needle was directed
into the right iliac bone with CT guidance. Two aspirates and one
core biopsy obtained.
FINDINGS: Needle position confirmed within the posterior right
iliac bone.

Complications: None
IMPRESSION: CT guided bone marrow aspirates and core biopsy.

## 2014-04-04 IMAGING — CT CT CHEST-ABD-PELV W/O CM
1 of 2 series · 13 of 29 positions shown, 18 images · non-contrast
Comparison: none

REASON FOR EXAM: (1) PNEUMONIA; (2) AYU GUZTIE PAIN, LYMPHOMA IN ABDOMEN
COMMENTS:

[Series 2: soft tissue · axial · 0.83mm/px · z∈[-401,+202]mm · 13 of 227 slices shown, 18 images]
[im 13/227  mediastinal]
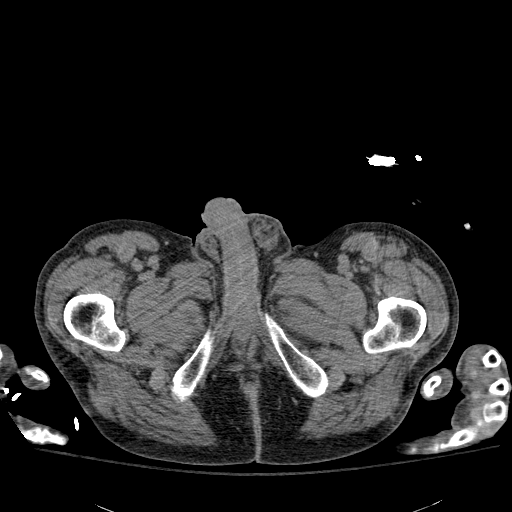
[im 13/227  bone]
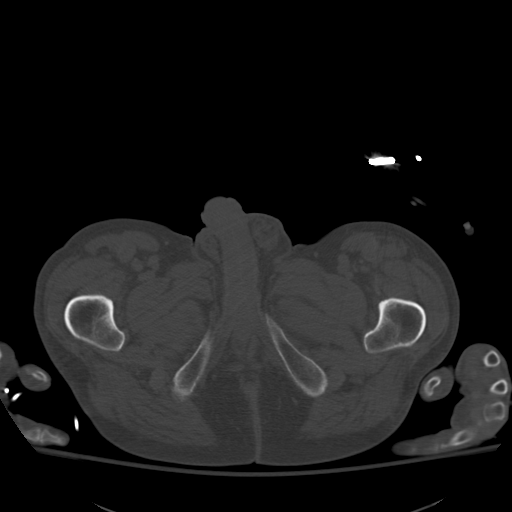
[im 38/227  mediastinal]
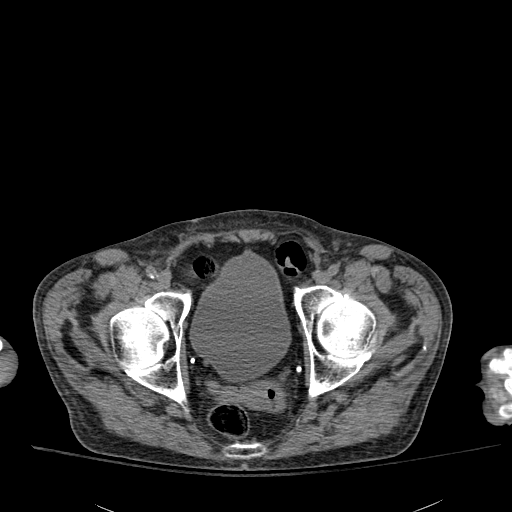
[im 51/227  mediastinal]
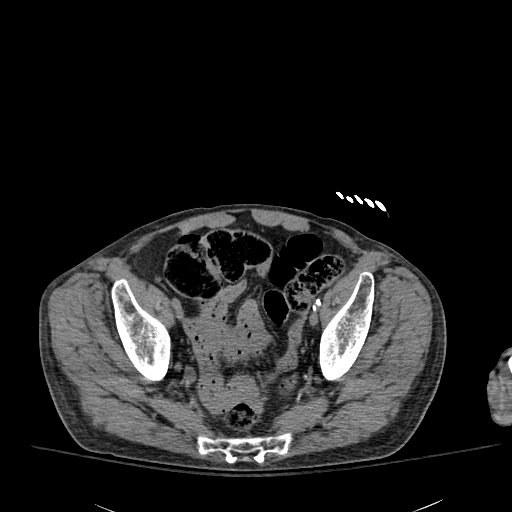
[im 76/227  mediastinal]
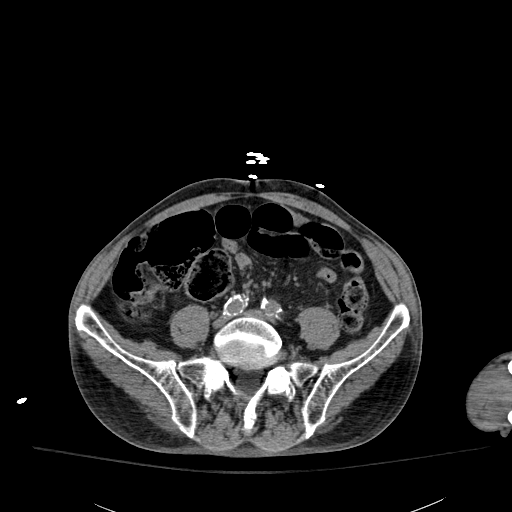
[im 88/227  mediastinal]
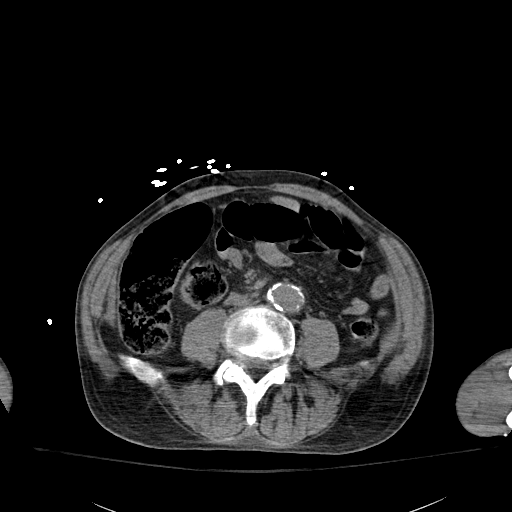
[im 111/227  mediastinal]
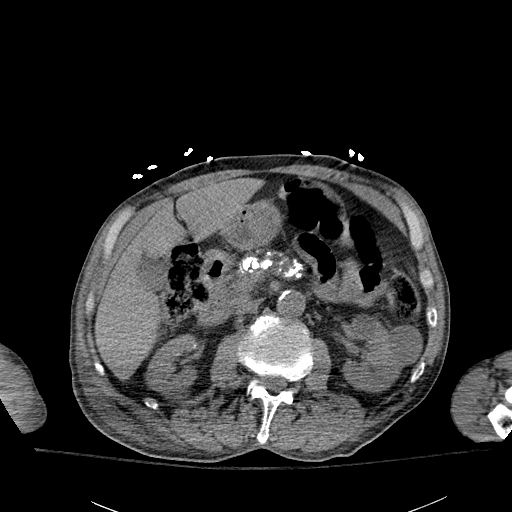
[im 114/227  mediastinal]
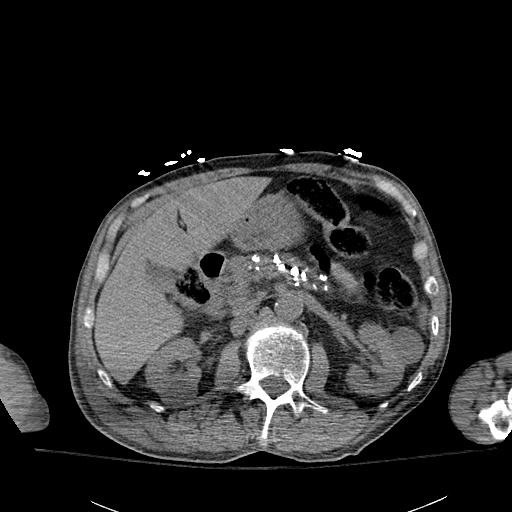
[im 139/227  mediastinal]
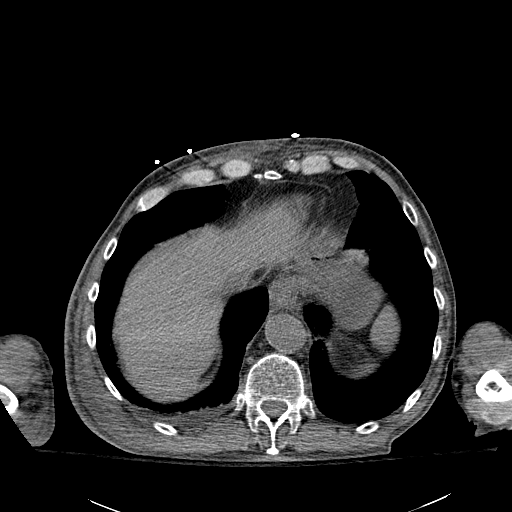
[im 151/227  mediastinal]
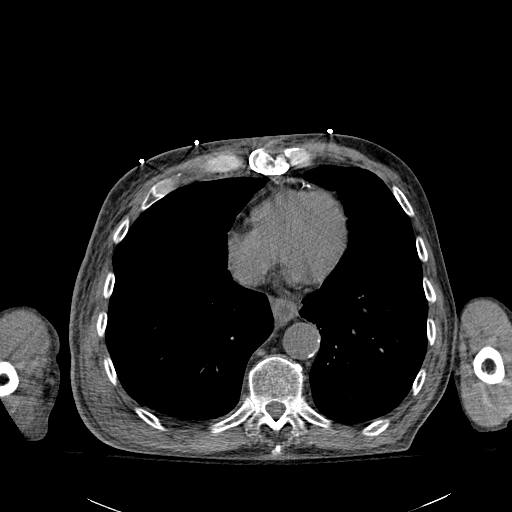
[im 151/227  bone]
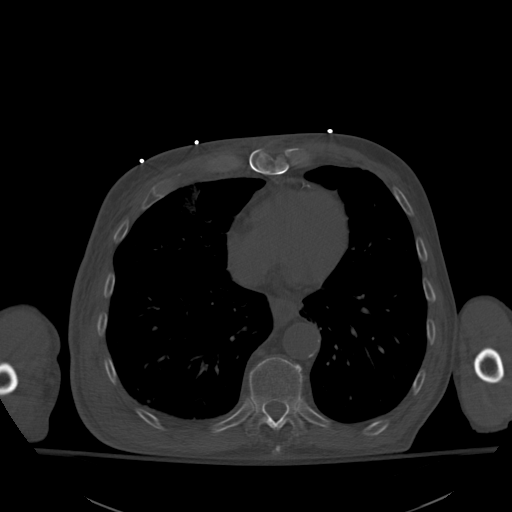
[im 176/227  mediastinal]
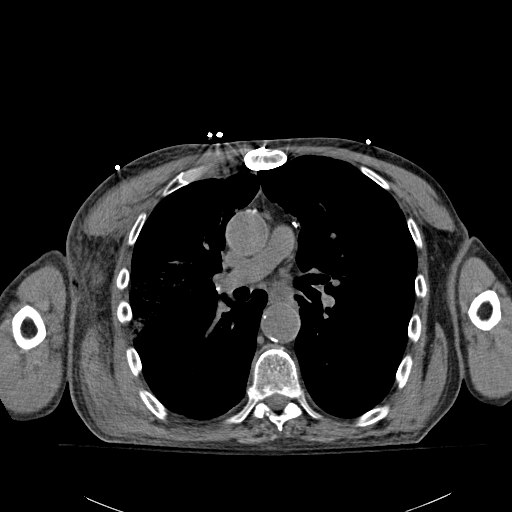
[im 176/227  lung]
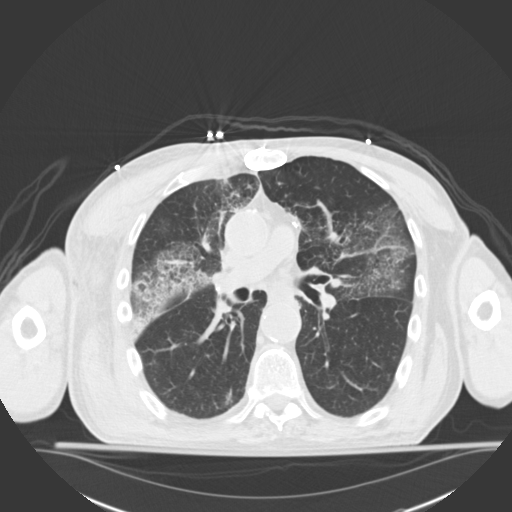
[im 189/227  mediastinal]
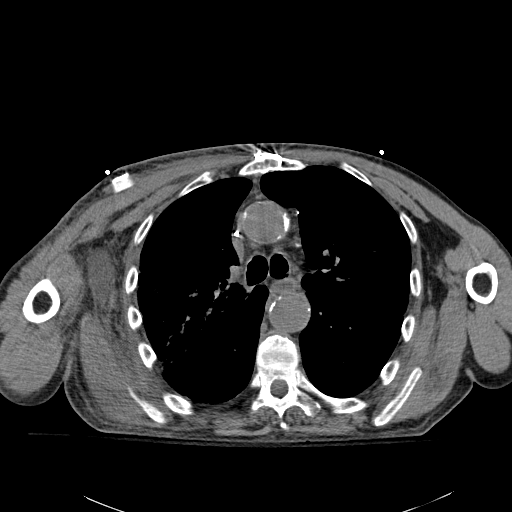
[im 189/227  lung]
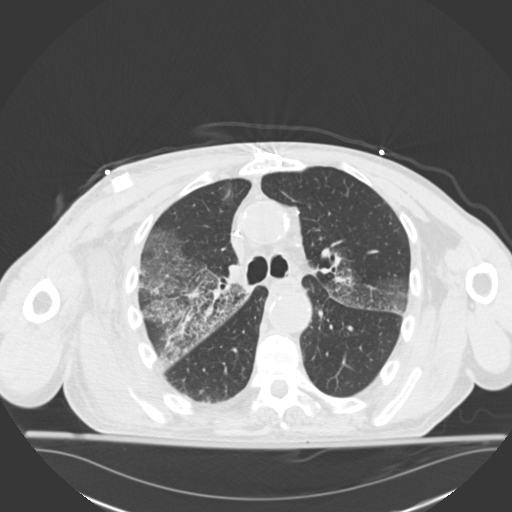
[im 201/227  lung]
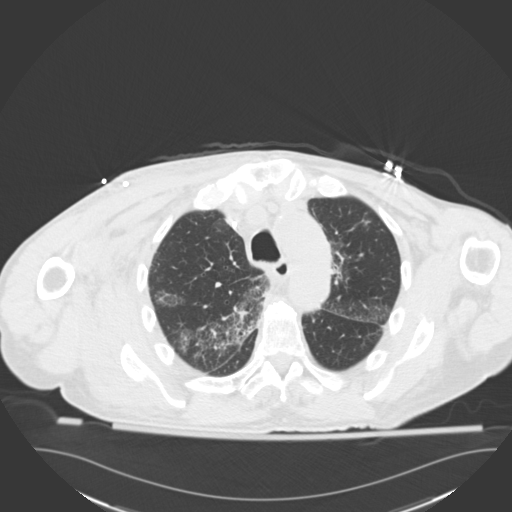
[im 214/227  mediastinal]
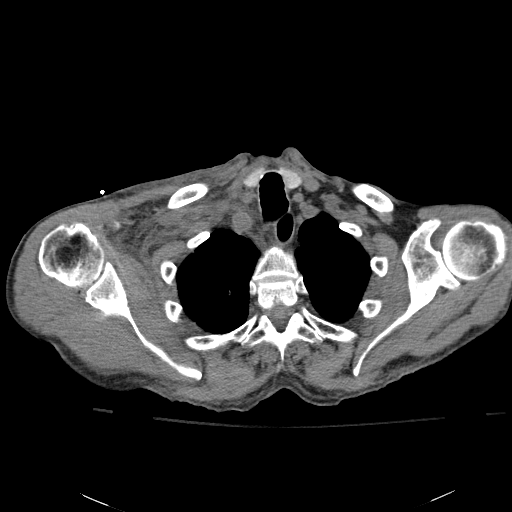
[im 214/227  lung]
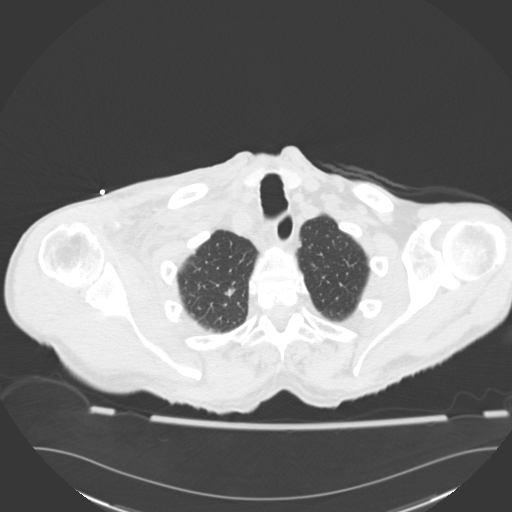

[13 of 29 positions shown; findings below may reference images not displayed]

PROCEDURE:     CT  - CT CHEST ABDOMEN AND PELVIS WO  - November 04, 2012  [DATE]

RESULT:     Noncontrast CT of the chest, abdomen and pelvis is reconstructed
at 3 mm slice thickness in the axial plane. Comparison is made to a chest
x-ray earlier on the same date and to a previous CT of the abdomen and
pelvis dated 08/19/2012.

Lung window images show patchy ill-defined nonconsolidated areas of
increased density that are likely interstitial and alveolar in the upper
lobes bilaterally with some patchy similar areas in the left lower lobe and
right middle lobe. Some minimal punctate density is seen in the right lung
lower lobe region. This is nonspecific. Minimal subpleural density is seen
in the left lower lobe on image 81. There is underlying emphysematous lung
disease. There is no definite endobronchial lesion appreciated. Images
suggest possibly some adherent secretions near the carina to the left in the
area of image 36. Prominent atherosclerotic calcification is seen. There is
a large soft tissue density in the right axilla with a short axis
measurement of 3.2 cm in the long axis measurement of 5.07 cm. This is in
the inferior right axillary/superior chest wall region near the level of the
carina. The fatty tissue adjacent to this region is indurated. Correlate for
regional inflammation or infection. The structure mentioned above has A. A.
Hounsfield reading of - and could represent fluid collection such as an
abscess.

There appears to be a moderately large amount of fecal material scattered
through the colon to the rectum. Radioactive implants are seen the prostate
gland. There is no ascites. No pneumoperitoneum is seen. There is no
evidence of bowel obstruction. No radiopaque gallstones are evident. The
noncontrast images of the liver and spleen are unremarkable. There is dense
calcification of the pancreas consistent with chronic pancreatitis. Multiple
bilateral renal cystic lesions appear present. The lesion adjacent to the
pancreatic head seen previously is not as well demonstrated but appears to
measure approximately 3.4 cm diameter anterior to posterior. The bony
structures appear intact.
IMPRESSION: 1. Bilateral patchy areas of presumed infiltrate predominately in the upper
lobes as described. Nonspecific areas of nodularity minimally seen in the
lungs superimposed on background of COPD. Pulmonology consultation is
recommended.
2. Abnormal appearance of the subcutaneous fat of the right axillary region
and chest wall which suggests mild inflammation. There is a low-attenuation
elliptical area best appreciated on image 45 which could represent a
necrotic lymph node or fluid collection. Correlate for focal abscess.
3. Stable lesion adjacent to the pancreatic head.
4. Chronic pancreatitis calcification present.
5. Multiple bilateral renal cystic lesions.
6. Atherosclerotic calcification.
7. Findings which can be consistent with mild constipation.

[REDACTED]
